# Patient Record
Sex: Male | Born: 1955 | Race: White | Hispanic: No | Marital: Married | Smoking: Former smoker
Health system: Southern US, Community
[De-identification: ages and names within clinical notes are randomized; demographics above are authoritative.]

## PROBLEM LIST (undated history)

## (undated) DIAGNOSIS — G4730 Sleep apnea, unspecified: Secondary | ICD-10-CM

## (undated) DIAGNOSIS — B019 Varicella without complication: Secondary | ICD-10-CM

## (undated) DIAGNOSIS — E785 Hyperlipidemia, unspecified: Secondary | ICD-10-CM

## (undated) DIAGNOSIS — Z86010 Personal history of colonic polyps: Secondary | ICD-10-CM

## (undated) DIAGNOSIS — Z9889 Other specified postprocedural states: Secondary | ICD-10-CM

## (undated) DIAGNOSIS — E039 Hypothyroidism, unspecified: Secondary | ICD-10-CM

## (undated) DIAGNOSIS — M1990 Unspecified osteoarthritis, unspecified site: Secondary | ICD-10-CM

## (undated) DIAGNOSIS — R112 Nausea with vomiting, unspecified: Secondary | ICD-10-CM

## (undated) DIAGNOSIS — I2510 Atherosclerotic heart disease of native coronary artery without angina pectoris: Secondary | ICD-10-CM

## (undated) DIAGNOSIS — I10 Essential (primary) hypertension: Secondary | ICD-10-CM

## (undated) DIAGNOSIS — I6529 Occlusion and stenosis of unspecified carotid artery: Secondary | ICD-10-CM

## (undated) DIAGNOSIS — T148XXA Other injury of unspecified body region, initial encounter: Secondary | ICD-10-CM

## (undated) DIAGNOSIS — C801 Malignant (primary) neoplasm, unspecified: Secondary | ICD-10-CM

## (undated) DIAGNOSIS — G540 Brachial plexus disorders: Secondary | ICD-10-CM

## (undated) HISTORY — PX: FRACTURE SURGERY: SHX138

## (undated) HISTORY — DX: Occlusion and stenosis of unspecified carotid artery: I65.29

## (undated) HISTORY — DX: Other injury of unspecified body region, initial encounter: T14.8XXA

## (undated) HISTORY — DX: Varicella without complication: B01.9

## (undated) HISTORY — DX: Other specified postprocedural states: R11.2

## (undated) HISTORY — DX: Hyperlipidemia, unspecified: E78.5

## (undated) HISTORY — DX: Essential (primary) hypertension: I10

## (undated) HISTORY — PX: OTHER SURGICAL HISTORY: SHX169

## (undated) HISTORY — DX: Other specified postprocedural states: Z98.890

## (undated) HISTORY — DX: Personal history of colonic polyps: Z86.010

---
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
## 1999-10-29 ENCOUNTER — Emergency Department (HOSPITAL_COMMUNITY): Admission: EM | Admit: 1999-10-29 | Discharge: 1999-10-29 | Payer: Self-pay | Admitting: Internal Medicine

## 2004-11-07 ENCOUNTER — Ambulatory Visit: Payer: Self-pay | Admitting: Family Medicine

## 2004-11-08 ENCOUNTER — Encounter: Admission: RE | Admit: 2004-11-08 | Discharge: 2004-11-08 | Payer: Self-pay | Admitting: Family Medicine

## 2005-10-16 ENCOUNTER — Ambulatory Visit: Payer: Self-pay | Admitting: Family Medicine

## 2005-10-26 ENCOUNTER — Ambulatory Visit: Payer: Self-pay | Admitting: Family Medicine

## 2005-11-01 ENCOUNTER — Ambulatory Visit: Payer: Self-pay | Admitting: Family Medicine

## 2005-11-09 ENCOUNTER — Encounter: Payer: Self-pay | Admitting: Family Medicine

## 2005-11-09 ENCOUNTER — Ambulatory Visit: Payer: Self-pay

## 2005-11-28 ENCOUNTER — Ambulatory Visit: Payer: Self-pay | Admitting: Family Medicine

## 2006-01-04 ENCOUNTER — Emergency Department (HOSPITAL_COMMUNITY): Admission: EM | Admit: 2006-01-04 | Discharge: 2006-01-05 | Payer: Self-pay | Admitting: Emergency Medicine

## 2006-01-07 ENCOUNTER — Ambulatory Visit: Payer: Self-pay | Admitting: Family Medicine

## 2006-02-14 ENCOUNTER — Encounter: Admission: RE | Admit: 2006-02-14 | Discharge: 2006-02-14 | Payer: Self-pay | Admitting: Specialist

## 2006-02-15 ENCOUNTER — Inpatient Hospital Stay (HOSPITAL_COMMUNITY): Admission: AC | Admit: 2006-02-15 | Discharge: 2006-02-17 | Payer: Self-pay

## 2006-03-28 ENCOUNTER — Ambulatory Visit: Payer: Self-pay | Admitting: Family Medicine

## 2006-11-25 ENCOUNTER — Ambulatory Visit: Payer: Self-pay | Admitting: Family Medicine

## 2007-01-15 ENCOUNTER — Ambulatory Visit: Payer: Self-pay | Admitting: Family Medicine

## 2008-01-22 ENCOUNTER — Telehealth: Payer: Self-pay | Admitting: Family Medicine

## 2008-01-23 ENCOUNTER — Ambulatory Visit: Payer: Self-pay | Admitting: Family Medicine

## 2008-01-23 DIAGNOSIS — Z9189 Other specified personal risk factors, not elsewhere classified: Secondary | ICD-10-CM | POA: Insufficient documentation

## 2008-01-23 DIAGNOSIS — I10 Essential (primary) hypertension: Secondary | ICD-10-CM | POA: Insufficient documentation

## 2008-04-14 ENCOUNTER — Ambulatory Visit: Payer: Self-pay | Admitting: Family Medicine

## 2008-04-14 LAB — CONVERTED CEMR LAB
Bilirubin Urine: NEGATIVE
Blood in Urine, dipstick: NEGATIVE
Glucose, Urine, Semiquant: NEGATIVE
Ketones, urine, test strip: NEGATIVE
Nitrite: NEGATIVE
Protein, U semiquant: NEGATIVE
Specific Gravity, Urine: 1.02
Urobilinogen, UA: 0.2
WBC Urine, dipstick: NEGATIVE
pH: 8.5

## 2008-04-20 LAB — CONVERTED CEMR LAB
ALT: 35 units/L (ref 0–53)
AST: 41 units/L — ABNORMAL HIGH (ref 0–37)
Albumin: 4.1 g/dL (ref 3.5–5.2)
Alkaline Phosphatase: 44 units/L (ref 39–117)
BUN: 14 mg/dL (ref 6–23)
Basophils Absolute: 0 10*3/uL (ref 0.0–0.1)
Basophils Relative: 0.4 % (ref 0.0–3.0)
Bilirubin, Direct: 0.1 mg/dL (ref 0.0–0.3)
CO2: 28 meq/L (ref 19–32)
Calcium: 9.5 mg/dL (ref 8.4–10.5)
Chloride: 108 meq/L (ref 96–112)
Cholesterol: 200 mg/dL (ref 0–200)
Creatinine, Ser: 1 mg/dL (ref 0.4–1.5)
Eosinophils Absolute: 0.3 10*3/uL (ref 0.0–0.7)
Eosinophils Relative: 5 % (ref 0.0–5.0)
GFR calc Af Amer: 102 mL/min
GFR calc non Af Amer: 84 mL/min
Glucose, Bld: 81 mg/dL (ref 70–99)
HCT: 44.9 % (ref 39.0–52.0)
HDL: 42.4 mg/dL (ref >39.0)
Hemoglobin: 15.9 g/dL (ref 13.0–17.0)
LDL Cholesterol: 135 mg/dL — ABNORMAL HIGH (ref 0–99)
Lymphocytes Relative: 32 % (ref 12.0–46.0)
MCHC: 35.5 g/dL (ref 30.0–36.0)
MCV: 95.2 fL (ref 78.0–100.0)
Monocytes Absolute: 0.7 10*3/uL (ref 0.1–1.0)
Monocytes Relative: 11 % (ref 3.0–12.0)
Neutro Abs: 3.1 10*3/uL (ref 1.4–7.7)
Neutrophils Relative %: 51.6 % (ref 43.0–77.0)
PSA: 0.68 ng/mL (ref 0.10–4.00)
Platelets: 173 10*3/uL (ref 150–400)
Potassium: 3.7 meq/L (ref 3.5–5.1)
RBC: 4.72 M/uL (ref 4.22–5.81)
RDW: 12.3 % (ref 11.5–14.6)
Sodium: 141 meq/L (ref 135–145)
TSH: 1.26 microintl units/mL (ref 0.35–5.50)
Total Bilirubin: 1 mg/dL (ref 0.3–1.2)
Total CHOL/HDL Ratio: 4.7
Total Protein: 6.7 g/dL (ref 6.0–8.3)
Triglycerides: 113 mg/dL (ref 0–149)
VLDL: 23 mg/dL (ref 0–40)
WBC: 6 10*3/uL (ref 4.5–10.5)

## 2008-04-21 ENCOUNTER — Ambulatory Visit: Payer: Self-pay | Admitting: Family Medicine

## 2008-05-14 ENCOUNTER — Ambulatory Visit: Payer: Self-pay | Admitting: Internal Medicine

## 2008-05-28 ENCOUNTER — Ambulatory Visit: Payer: Self-pay | Admitting: Internal Medicine

## 2008-05-28 HISTORY — PX: COLONOSCOPY: SHX174

## 2009-03-18 ENCOUNTER — Ambulatory Visit: Payer: Self-pay | Admitting: Family Medicine

## 2009-03-18 DIAGNOSIS — N509 Disorder of male genital organs, unspecified: Secondary | ICD-10-CM | POA: Insufficient documentation

## 2009-04-17 ENCOUNTER — Emergency Department (HOSPITAL_COMMUNITY): Admission: EM | Admit: 2009-04-17 | Discharge: 2009-04-17 | Payer: Self-pay | Admitting: Emergency Medicine

## 2010-08-23 ENCOUNTER — Ambulatory Visit
Admission: RE | Admit: 2010-08-23 | Discharge: 2010-08-23 | Payer: Self-pay | Source: Home / Self Care | Attending: Family Medicine | Admitting: Family Medicine

## 2010-08-23 ENCOUNTER — Other Ambulatory Visit: Payer: Self-pay | Admitting: Family Medicine

## 2010-08-23 LAB — CBC WITH DIFFERENTIAL/PLATELET
Basophils Absolute: 0 10*3/uL (ref 0.0–0.1)
Basophils Relative: 0.4 % (ref 0.0–3.0)
Eosinophils Absolute: 0.2 10*3/uL (ref 0.0–0.7)
Eosinophils Relative: 2.6 % (ref 0.0–5.0)
HCT: 41.4 % (ref 39.0–52.0)
Hemoglobin: 14.6 g/dL (ref 13.0–17.0)
Lymphocytes Relative: 21.9 % (ref 12.0–46.0)
Lymphs Abs: 1.7 10*3/uL (ref 0.7–4.0)
MCHC: 35.2 g/dL (ref 30.0–36.0)
MCV: 95.6 fl (ref 78.0–100.0)
Monocytes Absolute: 0.7 10*3/uL (ref 0.1–1.0)
Monocytes Relative: 8.7 % (ref 3.0–12.0)
Neutro Abs: 5 10*3/uL (ref 1.4–7.7)
Neutrophils Relative %: 66.4 % (ref 43.0–77.0)
Platelets: 186 10*3/uL (ref 150.0–400.0)
RBC: 4.34 Mil/uL (ref 4.22–5.81)
RDW: 13.6 % (ref 11.5–14.6)
WBC: 7.5 10*3/uL (ref 4.5–10.5)

## 2010-08-23 LAB — HEPATIC FUNCTION PANEL
ALT: 33 U/L (ref 0–53)
AST: 35 U/L (ref 0–37)
Albumin: 4 g/dL (ref 3.5–5.2)
Alkaline Phosphatase: 57 U/L (ref 39–117)
Bilirubin, Direct: 0.1 mg/dL (ref 0.0–0.3)
Total Bilirubin: 0.7 mg/dL (ref 0.3–1.2)
Total Protein: 6.9 g/dL (ref 6.0–8.3)

## 2010-08-23 LAB — TSH: TSH: 0.81 u[IU]/mL (ref 0.35–5.50)

## 2010-08-23 LAB — LIPID PANEL
Cholesterol: 183 mg/dL (ref 0–200)
HDL: 56.8 mg/dL (ref >39.00)
LDL Cholesterol: 118 mg/dL — ABNORMAL HIGH (ref 0–99)
Total CHOL/HDL Ratio: 3
Triglycerides: 42 mg/dL (ref 0.0–149.0)
VLDL: 8.4 mg/dL (ref 0.0–40.0)

## 2010-08-23 LAB — CONVERTED CEMR LAB
Bilirubin Urine: NEGATIVE
Blood in Urine, dipstick: NEGATIVE
Glucose, Urine, Semiquant: NEGATIVE
Ketones, urine, test strip: NEGATIVE
Nitrite: NEGATIVE
Protein, U semiquant: NEGATIVE
Specific Gravity, Urine: <1.005
Urobilinogen, UA: 0.2
WBC Urine, dipstick: NEGATIVE
pH: 6.5

## 2010-08-23 LAB — BASIC METABOLIC PANEL
BUN: 18 mg/dL (ref 6–23)
CO2: 29 mEq/L (ref 19–32)
Calcium: 9.3 mg/dL (ref 8.4–10.5)
Chloride: 104 mEq/L (ref 96–112)
Creatinine, Ser: 0.9 mg/dL (ref 0.4–1.5)
GFR: 97.51 mL/min (ref >60.00)
Glucose, Bld: 83 mg/dL (ref 70–99)
Potassium: 4.2 mEq/L (ref 3.5–5.1)
Sodium: 139 mEq/L (ref 135–145)

## 2010-08-23 LAB — PSA: PSA: 0.64 ng/mL (ref 0.10–4.00)

## 2010-08-28 ENCOUNTER — Encounter: Payer: Self-pay | Admitting: Family Medicine

## 2010-08-28 ENCOUNTER — Ambulatory Visit
Admission: RE | Admit: 2010-08-28 | Discharge: 2010-08-28 | Payer: Self-pay | Source: Home / Self Care | Attending: Family Medicine | Admitting: Family Medicine

## 2010-09-19 NOTE — Assessment & Plan Note (Signed)
Summary: fup meds/discussion//ccm   Vital Signs:  Patient profile:   55 year old male Weight:      245 pounds Temp:     97.6 degrees F oral Pulse rate:   74 / minute BP sitting:   138 / 88  (left arm) Cuff size:   large  Vitals Entered By: Army Fossa CMA (March 18, 2009 11:40 AM) CC: follow up on meds, dizziness when standing sometimes, right testicle pain.    History of Present Illness: Here to refill his BP med and to discuss intermittent mild pains in the right testicle which have been present for the past 5 years. These flare up when he has been driving in his car a lot but otherwise there seems to be no pattern with the pains. No trouble with urinations or ejaculations. No recent trauma, but he is S/P a vasectomy. His BP at home ranges 120-150/80-90 most of the time.   Allergies: 1)  ! Diovan  Past History:  Past Medical History: Reviewed history from 04/21/2008 and no changes required. Chickenpox Hypertension concussion 01-2006, resolved fractured right clavicle, scapula, and finger   Past Surgical History: Reviewed history from 04/21/2008 and no changes required. Arthroscopic knees bilaterally  Review of Systems  The patient denies anorexia, fever, weight loss, weight gain, vision loss, decreased hearing, hoarseness, chest pain, syncope, dyspnea on exertion, peripheral edema, prolonged cough, headaches, hemoptysis, abdominal pain, melena, hematochezia, severe indigestion/heartburn, hematuria, incontinence, genital sores, muscle weakness, suspicious skin lesions, transient blindness, difficulty walking, depression, unusual weight change, abnormal bleeding, enlarged lymph nodes, angioedema, breast masses, and testicular masses.    Physical Exam  General:  Well-developed,well-nourished,in no acute distress; alert,appropriate and cooperative throughout examination Neck:  No deformities, masses, or tenderness noted. Lungs:  Normal respiratory effort, chest expands  symmetrically. Lungs are clear to auscultation, no crackles or wheezes. Heart:  Normal rate and regular rhythm. S1 and S2 normal without gallop, murmur, click, rub or other extra sounds. Abdomen:  Bowel sounds positive,abdomen soft and non-tender without masses, organomegaly or hernias noted. Genitalia:  Testes bilaterally descended without nodularity, tenderness or masses. No scrotal masses or lesions. No penis lesions or urethral discharge. The right epididymis is slightly tender. No torsion.    Impression & Recommendations:  Problem # 1:  HYPERTENSION (ICD-401.9)  His updated medication list for this problem includes:    Moexipril Hcl 7.5 Mg Tabs (Moexipril hcl) .Marland Kitchen... 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 1 by mouth once daily  Patient Instructions: 1)  Meds refilled. The scrotal discomfort seems to be benign and appears to be related to pressure in the area. Adives him to wear loose pants and underwear. His vehicle has bucket seats, and he may be more comfortable in a vehicle with flatter seats.  2)  Please schedule a follow-up appointment as needed .  Prescriptions: MOEXIPRIL HCL 7.5 MG  TABS (MOEXIPRIL HCL) 1 by mouth once daily  #30 x 11   Entered and Authorized by:   Nelwyn Salisbury MD   Signed by:   Nelwyn Salisbury MD on 03/18/2009   Method used:   [facility]        [facility] [address]  [phone]* (retail)       [address] [place], [place]  [address]       Ph:  [phone] or [phone]       Fax: [phone]   RxID:   [id number]

## 2010-09-19 NOTE — Assessment & Plan Note (Signed)
Summary: blood pressure meds/mhf   Vital Signs:  Patient Profile:   55 Years Old Male Weight:      255 pounds Temp:     98.2 degrees F oral Pulse rate:   80 / minute Pulse rhythm:   regular BP sitting:   134 / 88  (left arm) Cuff size:   large  Vitals Entered By: Alfred Levins, CMA (January 23, 2008 3:09 PM)                 Chief Complaint:  renew meds.  History of Present Illness: Here for med refills. His BP seems to be stable.     Current Allergies: ! DIOVAN  Past Medical History:    Reviewed history and no changes required:       Chickenpox       Hypertension  Past Surgical History:    Arthroscopic bilaterally   Family History:    Family History of Colon CA 1st degree relative <60    Family History Hypertension  Social History:    Married    Alcohol use-yes    Drug use-no    Regular exercise-yes    Former smoker   Risk Factors:  Drug use:  no Alcohol use:  yes Exercise:  yes   Review of Systems  The patient denies anorexia, fever, weight loss, weight gain, vision loss, decreased hearing, hoarseness, chest pain, syncope, dyspnea on exertion, peripheral edema, prolonged cough, headaches, hemoptysis, abdominal pain, melena, hematochezia, severe indigestion/heartburn, hematuria, incontinence, genital sores, muscle weakness, suspicious skin lesions, transient blindness, difficulty walking, depression, unusual weight change, abnormal bleeding, enlarged lymph nodes, angioedema, breast masses, and testicular masses.     Physical Exam  General:     Well-developed,well-nourished,in no acute distress; alert,appropriate and cooperative throughout examination    Impression & Recommendations:  Problem # 1:  HYPERTENSION (ICD-401.9)  His updated medication list for this problem includes:    Moexipril Hcl 7.5 Mg Tabs (Moexipril hcl) .Marland Kitchen... 1 by mouth once daily   Complete Medication List: 1)  Moexipril Hcl 7.5 Mg Tabs (Moexipril hcl) .Marland Kitchen.. 1 by mouth once  daily   Patient Instructions: 1)  Refilled his med. He will set up cpx soon.   Prescriptions: MOEXIPRIL HCL 7.5 MG  TABS (MOEXIPRIL HCL) 1 by mouth once daily  #30 x 11   Entered and Authorized by:   Nelwyn Salisbury MD   Signed by:   Nelwyn Salisbury MD on 01/23/2008   Method used:   Electronically [facility] to ...       [facility]  [phone]*       [address]       [place], [place]  [address]       Ph: [phone] or [phone]       Fax: [phone]   RxID:   [id number]  ]

## 2010-09-19 NOTE — Procedures (Signed)
Summary: Colonoscopy   Colonoscopy  Procedure date:  05/28/2008  Findings:      Location:  [facility].    Procedures Next Due Date:    Colonoscopy: 05/2018  Patient Name: Terry Holt, Terry Holt. MRN: [id number] Procedure Procedures: Colonoscopy CPT: [id number].  Personnel: Endoscopist: Iva Boop, MD, [facility].  Referred By: Gershon Crane, MD.  Exam Location: Exam performed in Outpatient Clinic. Outpatient  Patient Consent: Procedure, Alternatives, Risks and Benefits discussed, consent obtained, from patient. Consent was obtained by the RN.  Indications  Average Risk Screening Routine.  History  Current Medications: Patient is not currently taking Coumadin.  Allergies: No known allergies.  Pre-Exam Physical: Performed May 28, 2008. Cardio-pulmonary exam, Rectal exam, HEENT exam , Abdominal exam, Mental status exam WNL.  Comments: Pt. history reviewed/updated, physical exam performed prior to initiation of sedation? YES Prostate is normal Exam Exam: Extent of exam reached: Cecum, extent intended: Cecum.  The cecum was identified by appendiceal orifice and IC valve. Patient position: on left side. Time to Cecum: 00:03:17. Time for Withdrawl: 00:10:59. Colon retroflexion performed. Images taken. ASA Classification: II. Tolerance: excellent.  Monitoring: Pulse and BP monitoring, Oximetry used. Supplemental O2 given.  Colon Prep Used MiraLax for colon prep. Prep results: good.  Sedation Meds: Patient assessed and found to be appropriate for moderate (conscious) sedation. Fentanyl 50 mcg. given IV. Versed 8 mg. given IV. Benadryl 25 given IV.  Findings - NORMAL EXAM: Cecum to Descending Colon.  - DIVERTICULOSIS: Sigmoid Colon.  HEMORRHOIDS: Internal. Size: Grade I.   Assessment  Comments: 1) MILD SIGMOID DIVERTICULOSIS 2) INTERNAL HEMORRHOIDS 3) GOOD PREP 4) OTHERWISE NORMAL SCREENING COLONOSCOPY Events  Unplanned Interventions: No intervention  was required.  Plans Patient Education: Patient given standard instructions for: Diverticulosis. Hemorrhoids. Patient instructed to get routine colonoscopy every 10 years.  Disposition: After procedure patient sent to recovery. After recovery patient sent home.    cc: Gershon Crane, MD   This report was created from the original endoscopy report, which was reviewed and signed by the above listed endoscopist.

## 2010-09-19 NOTE — Progress Notes (Signed)
Summary: refill  Phone Note Call from Patient Call back at Work Phone [phone]   Caller: pt live Call For: Clent Ridges  Summary of Call: moexipril hcl 7.5 mg  He is out Has scheduled appt for 6-5 Could you refill till then.  [facility]  Initial call taken by: Roselle Locus,  January 22, 2008 8:47 AM      Prescriptions: MOEXIPRIL HCL 7.5 MG  TABS (MOEXIPRIL HCL) 1 by mouth once daily needs ov  #30 x 0   Entered by:   Alfred Levins, CMA   Authorized by:   Nelwyn Salisbury MD   Signed by:   Alfred Levins, CMA on 01/22/2008   Method used:   Electronically sent to ...       [facility]  [phone]*       [address]       [place], [place]  [address]       Ph: [phone] or [phone]       Fax: [phone]   RxID:   [id number]

## 2010-09-19 NOTE — Assessment & Plan Note (Signed)
Summary: cpx/njr   Vital Signs:  Patient Profile:   55 Years Old Male Height:     73.5 inches Weight:      255 pounds Temp:     98.0 degrees F oral Pulse rate:   89 / minute Pulse rhythm:   regular BP sitting:   124 / 88  (left arm) Cuff size:   large  Vitals Entered By: Alfred Levins, CMA (April 21, 2008 1:39 PM)                 Chief Complaint:  cpx.  History of Present Illness: 55 yr old male for cpx. Feels good. Knows he is overweight.     Current Allergies (reviewed today): ! DIOVAN  Past Medical History:    Reviewed history from 01/23/2008 and no changes required:       Chickenpox       Hypertension       concussion 01-2006, resolved       fractured right clavicle, scapula, and finger   Past Surgical History:    Reviewed history from 01/23/2008 and no changes required:       Arthroscopic knees bilaterally   Family History:    Reviewed history from 01/23/2008 and no changes required:       Family History of Colon CA 1st degree relative <60       Family History Hypertension  Social History:    Reviewed history from 01/23/2008 and no changes required:       Married       Alcohol use-yes       Drug use-no       Regular exercise-yes       Former smoker    Review of Systems  The patient denies anorexia, fever, weight loss, weight gain, vision loss, decreased hearing, hoarseness, chest pain, syncope, dyspnea on exertion, peripheral edema, prolonged cough, headaches, hemoptysis, abdominal pain, melena, hematochezia, severe indigestion/heartburn, hematuria, incontinence, genital sores, muscle weakness, suspicious skin lesions, transient blindness, difficulty walking, depression, unusual weight change, abnormal bleeding, enlarged lymph nodes, angioedema, breast masses, and testicular masses.     Physical Exam  General:     overweight-appearing.   Head:     Normocephalic and atraumatic without obvious abnormalities. No apparent alopecia or balding.  Eyes:     No corneal or conjunctival inflammation noted. EOMI. Perrla. Funduscopic exam benign, without hemorrhages, exudates or papilledema. Vision grossly normal. Ears:     External ear exam shows no significant lesions or deformities.  Otoscopic examination reveals clear canals, tympanic membranes are intact bilaterally without bulging, retraction, inflammation or discharge. Hearing is grossly normal bilaterally. Nose:     External nasal examination shows no deformity or inflammation. Nasal mucosa are pink and moist without lesions or exudates. Mouth:     Oral mucosa and oropharynx without lesions or exudates.  Teeth in good repair. Neck:     No deformities, masses, or tenderness noted. Chest Wall:     No deformities, masses, tenderness or gynecomastia noted. Lungs:     Normal respiratory effort, chest expands symmetrically. Lungs are clear to auscultation, no crackles or wheezes. Heart:     Normal rate and regular rhythm. S1 and S2 normal without gallop, murmur, click, rub or other extra sounds. EKG normal. Abdomen:     Bowel sounds positive,abdomen soft and non-tender without masses, organomegaly or hernias noted. Rectal:     No external abnormalities noted. Normal sphincter tone. No rectal masses or tenderness. Heme neg. Genitalia:  Testes bilaterally descended without nodularity, tenderness or masses. No scrotal masses or lesions. No penis lesions or urethral discharge. Prostate:     Prostate gland firm and smooth, no enlargement, nodularity, tenderness, mass, asymmetry or induration. Msk:     No deformity or scoliosis noted of thoracic or lumbar spine.   Pulses:     R and L carotid,radial,femoral,dorsalis pedis and posterior tibial pulses are full and equal bilaterally Extremities:     No clubbing, cyanosis, edema, or deformity noted with normal full range of motion of all joints.   Neurologic:     No cranial nerve deficits noted. Station and gait are normal. Plantar  reflexes are down-going bilaterally. DTRs are symmetrical throughout. Sensory, motor and coordinative functions appear intact. Skin:     Intact without suspicious lesions or rashes Cervical Nodes:     No lymphadenopathy noted Axillary Nodes:     No palpable lymphadenopathy Inguinal Nodes:     No significant adenopathy Psych:     Cognition and judgment appear intact. Alert and cooperative with normal attention span and concentration. No apparent delusions, illusions, hallucinations    Impression & Recommendations:  Problem # 1:  WELL ADULT EXAM (ICD-V70.0)  Orders: Hemoccult Guaiac-1 spec.(in office) (78469) EKG w/ Interpretation (93000) Gastroenterology Referral (GI)   Complete Medication List: 1)  Moexipril Hcl 7.5 Mg Tabs (Moexipril hcl) .Marland Kitchen.. 1 by mouth once daily   Patient Instructions: 1)  You need to lose weight. Consider a lower calorie diet and regular exercise.  2)  Schedule a colonoscopy/sigmoidoscopy to help detect colon cancer.   ]

## 2010-09-21 NOTE — Assessment & Plan Note (Signed)
Summary: cpx//ccm   Vital Signs:  Patient profile:   55 year old male Height:      72.5 inches Weight:      203 pounds O2 Sat:      97 % Temp:     98.8 degrees F Pulse rate:   66 / minute BP sitting:   140 / 90  (left arm) Cuff size:   large  Vitals Entered By: Pura Spice, RN (August 28, 2010 2:01 PM) CC: cpx    History of Present Illness: 55 yr old male for a cpx. He feels great and has no complaints. He has lost a lot of weight and he has stopped taking his BP meds. His BP at home every day is around the 120s and 130s over 80s.   Allergies: 1)  ! Diovan  Past History:  Past Medical History: Reviewed history from 04/21/2008 and no changes required. Chickenpox Hypertension concussion 01-2006, resolved fractured right clavicle, scapula, and finger   Past Surgical History: Reviewed history from 04/21/2008 and no changes required. Arthroscopic knees bilaterally  Family History: Reviewed history from 01/23/2008 and no changes required. Family History of Colon CA 1st degree relative <60 Family History Hypertension  Social History: Reviewed history from 01/23/2008 and no changes required. Married Alcohol use-yes Drug use-no Regular exercise-yes Former smoker  Review of Systems  The patient denies anorexia, fever, weight gain, vision loss, decreased hearing, hoarseness, chest pain, syncope, dyspnea on exertion, peripheral edema, prolonged cough, headaches, hemoptysis, abdominal pain, melena, hematochezia, severe indigestion/heartburn, hematuria, incontinence, genital sores, muscle weakness, suspicious skin lesions, transient blindness, difficulty walking, depression, unusual weight change, abnormal bleeding, enlarged lymph nodes, angioedema, breast masses, and testicular masses.    Physical Exam  General:  Well-developed,well-nourished,in no acute distress; alert,appropriate and cooperative throughout examination Head:  Normocephalic and atraumatic without obvious  abnormalities. No apparent alopecia or balding. Eyes:  No corneal or conjunctival inflammation noted. EOMI. Perrla. Funduscopic exam benign, without hemorrhages, exudates or papilledema. Vision grossly normal. Ears:  External ear exam shows no significant lesions or deformities.  Otoscopic examination reveals clear canals, tympanic membranes are intact bilaterally without bulging, retraction, inflammation or discharge. Hearing is grossly normal bilaterally. Nose:  External nasal examination shows no deformity or inflammation. Nasal mucosa are pink and moist without lesions or exudates. Mouth:  Oral mucosa and oropharynx without lesions or exudates.  Teeth in good repair. Neck:  No deformities, masses, or tenderness noted. Chest Wall:  No deformities, masses, tenderness or gynecomastia noted. Lungs:  Normal respiratory effort, chest expands symmetrically. Lungs are clear to auscultation, no crackles or wheezes. Heart:  Normal rate and regular rhythm. S1 and S2 normal without gallop, murmur, click, rub or other extra sounds. EKG normal Abdomen:  Bowel sounds positive,abdomen soft and non-tender without masses, organomegaly or hernias noted. Rectal:  No external abnormalities noted. Normal sphincter tone. No rectal masses or tenderness. Heme neg.  Genitalia:  Testes bilaterally descended without nodularity, tenderness or masses. No scrotal masses or lesions. No penis lesions or urethral discharge. Prostate:  Prostate gland firm and smooth, no enlargement, nodularity, tenderness, mass, asymmetry or induration. Msk:  No deformity or scoliosis noted of thoracic or lumbar spine.   Pulses:  R and L carotid,radial,femoral,dorsalis pedis and posterior tibial pulses are full and equal bilaterally Extremities:  No clubbing, cyanosis, edema, or deformity noted with normal full range of motion of all joints.   Neurologic:  No cranial nerve deficits noted. Station and gait are normal. Plantar reflexes are  down-going bilaterally. DTRs are symmetrical throughout. Sensory, motor and coordinative functions appear intact. Skin:  Intact without suspicious lesions or rashes Cervical Nodes:  No lymphadenopathy noted Axillary Nodes:  No palpable lymphadenopathy Inguinal Nodes:  No significant adenopathy Psych:  Cognition and judgment appear intact. Alert and cooperative with normal attention span and concentration. No apparent delusions, illusions, hallucinations   Impression & Recommendations:  Problem # 1:  WELL ADULT EXAM (ICD-V70.0)  Orders: Hemoccult Guaiac-1 spec.(in office) (82270) EKG w/ Interpretation (93000)  Other Orders: Tdap => 50yrs IM (57846) Admin 1st Vaccine (96295)  Patient Instructions: 1)  Please schedule a follow-up appointment in 1 year.    Orders Added: 1)  Tdap => 7yrs IM [90715] 2)  Admin 1st Vaccine [90471] 3)  Est. Patient 40-64 years [99396] 4)  Hemoccult Guaiac-1 spec.(in office) [82270] 5)  EKG w/ Interpretation [93000]   Immunizations Administered:  Tetanus Vaccine:    Vaccine Type: Tdap    Site: left deltoid    Mfr: GlaxoSmithKline    Dose: 0.5 ml    Route: IM    Given by: Pura Spice, RN    Exp. Date: 06/08/2012    Lot #: [id number]    VIS given: 07/07/08 version given August 28, 2010.   Immunizations Administered:  Tetanus Vaccine:    Vaccine Type: Tdap    Site: left deltoid    Mfr: GlaxoSmithKline    Dose: 0.5 ml    Route: IM    Given by: Pura Spice, RN    Exp. Date: 06/08/2012    Lot #: [id number]    VIS given: 07/07/08 version given August 28, 2010.

## 2010-12-27 ENCOUNTER — Encounter: Payer: Self-pay | Admitting: Family Medicine

## 2010-12-27 ENCOUNTER — Ambulatory Visit (INDEPENDENT_AMBULATORY_CARE_PROVIDER_SITE_OTHER): Payer: BC Managed Care – PPO | Admitting: Family Medicine

## 2010-12-27 VITALS — BP 130/92 | HR 57 | Temp 98.0°F | Resp 14 | Wt 200.5 lb

## 2010-12-27 DIAGNOSIS — J309 Allergic rhinitis, unspecified: Secondary | ICD-10-CM

## 2010-12-27 DIAGNOSIS — N529 Male erectile dysfunction, unspecified: Secondary | ICD-10-CM

## 2010-12-27 LAB — TESTOSTERONE: Testosterone: 250.06 ng/dL — ABNORMAL LOW (ref 350.00–890.00)

## 2010-12-27 MED ORDER — FLUTICASONE PROPIONATE 50 MCG/ACT NA SUSP: 2.0000 | Freq: Every day | NASAL | Status: DC

## 2010-12-27 NOTE — Progress Notes (Signed)
  Subjective:    Patient ID: Terry Holt, male    DOB: 03/09/1956, 55 y.o.   MRN: [id number]  HPI Here with 2 problems. First he has had a lot of nasal congestion for several months. This makes it hard to smell or taste anything. He has tried several OTC antihistamines with no results. Also for several months he has noticed that his erections are not as firm as they used to be and his orgasms are not as intense as before. He had normal labs in January when we did his cpx. He denies any anxiety or depression issues.    Review of Systems  Constitutional: Negative.   HENT: Positive for congestion. Negative for nosebleeds, postnasal drip and sinus pressure.   Eyes: Negative.   Respiratory: Negative.   Genitourinary: Negative.   Psychiatric/Behavioral: Negative.        Objective:   Physical Exam  Constitutional: He is oriented to person, place, and time. He appears well-developed and well-nourished.  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 The membranes are a bit inflamed  Neurological: He is alert and oriented to person, place, and time.  Psychiatric: He has a normal mood and affect. His behavior is normal.          Assessment & Plan:  Try a nasal steroid spray. Gave him samples to try Cialis 20mg  prn. Check a testosterone level.

## 2010-12-28 ENCOUNTER — Telehealth: Payer: Self-pay | Admitting: *Deleted

## 2010-12-28 MED ORDER — TESTOSTERONE 20.25 MG/ACT (1.62%) TD GEL: 10.0000 g/d | TRANSDERMAL | Status: DC

## 2010-12-28 NOTE — Telephone Encounter (Signed)
Message copied by Burnard Leigh on Thu Dec 28, 2010 12:58 PM ------      Message from: Dwaine Deter      Created: Wed Dec 27, 2010  5:26 PM       His testosterone level is low. I suggest starting Androgel 1.62%pump,  to apply 8 pumps daily. Call in 6 month supply and recheck a level in 6 months

## 2010-12-28 NOTE — Telephone Encounter (Signed)
LMOM to have Pt call our office to discuss lab results. [facility]. [Pt needs to be informed of this]

## 2010-12-28 NOTE — Telephone Encounter (Signed)
Message copied by Burnard Leigh on Thu Dec 28, 2010 12:49 PM ------      Message from: Dwaine Deter      Created: Wed Dec 27, 2010  5:26 PM       His testosterone level is low. I suggest starting Androgel 1.62%pump,  to apply 8 pumps daily. Call in 6 month supply and recheck a level in 6 months

## 2010-12-29 ENCOUNTER — Telehealth: Payer: Self-pay | Admitting: Family Medicine

## 2010-12-29 NOTE — Telephone Encounter (Signed)
Pt aware. Pt also notes that [facility] and a Prior Auth is required for Androgel. PA request received, per Aram Beecham.

## 2010-12-29 NOTE — Telephone Encounter (Signed)
Please clarify directions given and quantity for androgel pump.  Directions state 10grams qd however quantiity was only written for 75 grams, which would not be enough for a month supply.

## 2010-12-29 NOTE — Telephone Encounter (Signed)
Per Dr. Clent Ridges, 4 pumps qd. Rx instructions clarified with pharm. Corrected Prior Auth sent by pharm.

## 2010-12-29 NOTE — Telephone Encounter (Signed)
Requesting lab results

## 2011-01-02 ENCOUNTER — Telehealth: Payer: Self-pay | Admitting: *Deleted

## 2011-01-02 NOTE — Telephone Encounter (Signed)
Needs update about a prescription that his insurance is attempted to get a pre-cert from [place].

## 2011-01-03 NOTE — Telephone Encounter (Signed)
Left message. Dr fry has pa form. I will call patient when completed.
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
## 2011-01-25 ENCOUNTER — Ambulatory Visit (INDEPENDENT_AMBULATORY_CARE_PROVIDER_SITE_OTHER): Payer: BC Managed Care – PPO | Admitting: Family Medicine

## 2011-01-25 ENCOUNTER — Encounter: Payer: Self-pay | Admitting: Family Medicine

## 2011-01-25 VITALS — BP 140/92 | HR 74 | Temp 98.6°F | Wt 203.0 lb

## 2011-01-25 DIAGNOSIS — N529 Male erectile dysfunction, unspecified: Secondary | ICD-10-CM

## 2011-01-25 DIAGNOSIS — J3489 Other specified disorders of nose and nasal sinuses: Secondary | ICD-10-CM

## 2011-01-25 DIAGNOSIS — E291 Testicular hypofunction: Secondary | ICD-10-CM

## 2011-01-25 NOTE — Progress Notes (Signed)
  Subjective:    Patient ID: Terry Holt, male    DOB: 01/10/1956, 55 y.o.   MRN: [id number]  HPI Here to follow up on hypogonadism, ED, nasal congestion, and loss of taste and smell. He tried Flonase sprays and did not see any difference, so he stopped them. He wants to try something else. He is very happy with Androgel. He feels better and he has already seen improvements in his erections. He did not like Cialis because it caused his face to flush.    Review of Systems  Constitutional: Negative.   HENT: Positive for congestion.   Genitourinary: Negative.        Objective:   Physical Exam  Constitutional: He appears well-developed and well-nourished.  HENT:  Head: Normocephalic and atraumatic.  Right Ear: External ear normal.  Left Ear: External ear normal.  Nose: Nose normal.  Mouth/Throat: Oropharynx is clear and moist. No oropharyngeal exudate.          Assessment & Plan:  Stop Cialis and Flonase. Refer to ENT for nasal obstruction. Continue Androgel.

## 2011-02-23 ENCOUNTER — Other Ambulatory Visit: Payer: Self-pay | Admitting: Otolaryngology

## 2011-02-23 DIAGNOSIS — R430 Anosmia: Secondary | ICD-10-CM

## 2011-03-13 ENCOUNTER — Ambulatory Visit
Admission: RE | Admit: 2011-03-13 | Discharge: 2011-03-13 | Disposition: A | Discharge status: Home / Self Care | Payer: BC Managed Care – PPO | Source: Ambulatory Visit | Attending: Otolaryngology | Admitting: Otolaryngology

## 2011-03-13 DIAGNOSIS — R430 Anosmia: Secondary | ICD-10-CM

## 2011-03-13 MED ORDER — GADOBENATE DIMEGLUMINE 529 MG/ML IV SOLN
18.0000 mL | Freq: Once | INTRAVENOUS | Status: AC | PRN
  Administered 2011-03-13: 18 mL via INTRAVENOUS

## 2011-06-11 ENCOUNTER — Encounter: Payer: Self-pay | Admitting: Family Medicine

## 2011-06-12 ENCOUNTER — Encounter: Payer: Self-pay | Admitting: Family Medicine

## 2011-06-12 ENCOUNTER — Ambulatory Visit (INDEPENDENT_AMBULATORY_CARE_PROVIDER_SITE_OTHER): Payer: BC Managed Care – PPO | Admitting: Family Medicine

## 2011-06-12 VITALS — BP 128/84 | HR 76 | Temp 98.3°F | Wt 204.0 lb

## 2011-06-12 DIAGNOSIS — M940 Chondrocostal junction syndrome [Tietze]: Secondary | ICD-10-CM
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
## 2011-07-03 ENCOUNTER — Telehealth: Payer: Self-pay | Admitting: Family Medicine

## 2011-07-03 DIAGNOSIS — E291 Testicular hypofunction: Secondary | ICD-10-CM

## 2011-07-03 NOTE — Telephone Encounter (Signed)
Pt is on androgel requesting blood work.

## 2011-07-04 ENCOUNTER — Other Ambulatory Visit (INDEPENDENT_AMBULATORY_CARE_PROVIDER_SITE_OTHER): Payer: BC Managed Care – PPO

## 2011-07-04 DIAGNOSIS — E291 Testicular hypofunction: Secondary | ICD-10-CM

## 2011-07-04 NOTE — Telephone Encounter (Signed)
Pt is sch for lab today 4pm

## 2011-07-04 NOTE — Telephone Encounter (Signed)
I put in the order. He just needs to schedule it

## 2011-07-09 ENCOUNTER — Other Ambulatory Visit: Payer: Self-pay | Admitting: Family Medicine

## 2011-07-09 ENCOUNTER — Telehealth: Payer: Self-pay | Admitting: Family Medicine

## 2011-07-09 DIAGNOSIS — E291 Testicular hypofunction: Secondary | ICD-10-CM

## 2011-07-09 LAB — TESTOSTERONE: Testosterone: 201.55 ng/dL — ABNORMAL LOW (ref 350.00–890.00)

## 2011-07-09 NOTE — Progress Notes (Signed)
Quick Note:  Pt informed ______ 

## 2011-07-09 NOTE — Telephone Encounter (Signed)
We will refer to Urology

## 2011-07-10 ENCOUNTER — Telehealth: Payer: Self-pay | Admitting: Family Medicine

## 2011-07-10 NOTE — Telephone Encounter (Signed)
Pt has a appointment to see Urology in December. He only has about a week or so of the Androgel left, should he get more to last until his visit?

## 2011-07-11 NOTE — Telephone Encounter (Signed)
No I would just wait and see what they want to do

## 2011-07-11 NOTE — Telephone Encounter (Signed)
Spoke with pt

## 2013-02-23 ENCOUNTER — Encounter: Payer: Self-pay | Admitting: Family Medicine

## 2013-02-23 ENCOUNTER — Ambulatory Visit (INDEPENDENT_AMBULATORY_CARE_PROVIDER_SITE_OTHER): Payer: BC Managed Care – PPO | Admitting: Family Medicine

## 2013-02-23 VITALS — BP 168/94 | HR 71 | Temp 98.6°F | Ht 72.75 in | Wt 209.0 lb

## 2013-02-23 DIAGNOSIS — Z0000 Encounter for general adult medical examination without abnormal findings: Secondary | ICD-10-CM

## 2013-02-23 LAB — CBC WITH DIFFERENTIAL/PLATELET
Basophils Absolute: 0 10*3/uL (ref 0.0–0.1)
Basophils Relative: 0.4 % (ref 0.0–3.0)
Eosinophils Absolute: 0.1 10*3/uL (ref 0.0–0.7)
Eosinophils Relative: 0.8 % (ref 0.0–5.0)
HCT: 45.3 % (ref 39.0–52.0)
Hemoglobin: 15.6 g/dL (ref 13.0–17.0)
Lymphocytes Relative: 18.6 % (ref 12.0–46.0)
Lymphs Abs: 1.5 10*3/uL (ref 0.7–4.0)
MCHC: 34.4 g/dL (ref 30.0–36.0)
MCV: 97.3 fl (ref 78.0–100.0)
Monocytes Absolute: 0.6 10*3/uL (ref 0.1–1.0)
Monocytes Relative: 7.7 % (ref 3.0–12.0)
Neutro Abs: 5.7 10*3/uL (ref 1.4–7.7)
Neutrophils Relative %: 72.5 % (ref 43.0–77.0)
Platelets: 196 10*3/uL (ref 150.0–400.0)
RBC: 4.66 Mil/uL (ref 4.22–5.81)
RDW: 13.7 % (ref 11.5–14.6)
WBC: 7.8 10*3/uL (ref 4.5–10.5)

## 2013-02-23 LAB — BASIC METABOLIC PANEL
BUN: 18 mg/dL (ref 6–23)
CO2: 27 mEq/L (ref 19–32)
Calcium: 9.4 mg/dL (ref 8.4–10.5)
Chloride: 103 mEq/L (ref 96–112)
Creatinine, Ser: 1 mg/dL (ref 0.4–1.5)
GFR: 85.21 mL/min (ref >60.00)
Glucose, Bld: 91 mg/dL (ref 70–99)
Potassium: 3.7 mEq/L (ref 3.5–5.1)
Sodium: 139 mEq/L (ref 135–145)

## 2013-02-23 LAB — POCT URINALYSIS DIPSTICK
Bilirubin, UA: NEGATIVE
Blood, UA: NEGATIVE
Glucose, UA: NEGATIVE
Ketones, UA: NEGATIVE
Leukocytes, UA: NEGATIVE
Nitrite, UA: NEGATIVE
Spec Grav, UA: 1.02
Urobilinogen, UA: 0.2
pH, UA: 7

## 2013-02-23 LAB — LIPID PANEL
Cholesterol: 194 mg/dL (ref 0–200)
HDL: 62.7 mg/dL (ref >39.00)
LDL Cholesterol: 116 mg/dL — ABNORMAL HIGH (ref 0–99)
Total CHOL/HDL Ratio: 3
Triglycerides: 76 mg/dL (ref 0.0–149.0)
VLDL: 15.2 mg/dL (ref 0.0–40.0)

## 2013-02-23 LAB — HEPATIC FUNCTION PANEL
ALT: 20 U/L (ref 0–53)
AST: 27 U/L (ref 0–37)
Albumin: 4.5 g/dL (ref 3.5–5.2)
Alkaline Phosphatase: 43 U/L (ref 39–117)
Bilirubin, Direct: 0.1 mg/dL (ref 0.0–0.3)
Total Bilirubin: 1 mg/dL (ref 0.3–1.2)
Total Protein: 6.9 g/dL (ref 6.0–8.3)

## 2013-02-23 LAB — PSA: PSA: 0.88 ng/mL (ref 0.10–4.00)

## 2013-02-23 LAB — TSH: TSH: 1.05 u[IU]/mL (ref 0.35–5.50)

## 2013-02-23 NOTE — Progress Notes (Signed)
  Subjective:    Patient ID: Terry Holt, male    DOB: 24-Sep-1955, 57 y.o.   MRN: [id number]  HPI 57 yr old male for a cpx. He feels well although he mentions some occasional fluttering in the chest a few weeks ago, especially in bed at night. No SOB or chest pain. He has been under a lot of stress at work. Also for a time he was drinking a lot of coffee and taking Mucinex D for allergies. As soon as he stopped th Mucinex D and cut back on the coffee, the fluttering went away.   Review of Systems  Constitutional: Negative.   HENT: Negative.   Eyes: Negative.   Respiratory: Negative.   Cardiovascular: Negative.   Gastrointestinal: Negative.   Genitourinary: Negative.   Musculoskeletal: Negative.   Skin: Negative.   Neurological: Negative.   Psychiatric/Behavioral: Negative.        Objective:   Physical Exam  Constitutional: He is oriented to person, place, and time. He appears well-developed and well-nourished. No distress.  HENT:  Head: Normocephalic and atraumatic.  Right Ear: External ear normal.  Left Ear: External ear normal.  Nose: Nose normal.  Mouth/Throat: Oropharynx is clear and moist. No oropharyngeal exudate.  Eyes: Conjunctivae and EOM are normal. Pupils are equal, round, and reactive to light. Right eye exhibits no discharge. Left eye exhibits no discharge. No scleral icterus.  Neck: Neck supple. No JVD present. No tracheal deviation present. No thyromegaly present.  Cardiovascular: Normal rate, regular rhythm, normal heart sounds and intact distal pulses.  Exam reveals no gallop and no friction rub.   No murmur heard. EKG normal   Pulmonary/Chest: Effort normal and breath sounds normal. No respiratory distress. He has no wheezes. He has no rales. He exhibits no tenderness.  Abdominal: Soft. Bowel sounds are normal. He exhibits no distension and no mass. There is no tenderness. There is no rebound and no guarding.  Genitourinary: Rectum normal, prostate normal  and penis normal. Guaiac negative stool. No penile tenderness.  Musculoskeletal: Normal range of motion. He exhibits no edema and no tenderness.  Lymphadenopathy:    He has no cervical adenopathy.  Neurological: He is alert and oriented to person, place, and time. He has normal reflexes. No cranial nerve deficit. He exhibits normal muscle tone. Coordination normal.  Skin: Skin is warm and dry. No rash noted. He is not diaphoretic. No erythema. No pallor.  Psychiatric: He has a normal mood and affect. His behavior is normal. Judgment and thought content normal.          Assessment & Plan:  Well exam. Get fasting labs today. He was probably having palpitations from too much caffeine and pseudoephedrine. He will return if these come back.

## 2013-02-25 NOTE — Progress Notes (Signed)
Quick Note:  I released results in my chart. ______ 

## 2013-06-25 ENCOUNTER — Other Ambulatory Visit: Payer: Self-pay

## 2013-10-02 ENCOUNTER — Encounter: Payer: Self-pay | Admitting: Family Medicine

## 2013-10-02 ENCOUNTER — Ambulatory Visit (INDEPENDENT_AMBULATORY_CARE_PROVIDER_SITE_OTHER): Payer: BC Managed Care – PPO | Admitting: Family Medicine

## 2013-10-02 VITALS — BP 142/80 | HR 79 | Temp 98.1°F | Ht 72.75 in | Wt 213.0 lb

## 2013-10-02 DIAGNOSIS — R109 Unspecified abdominal pain: Secondary | ICD-10-CM

## 2013-10-02 DIAGNOSIS — R1032 Left lower quadrant pain: Secondary | ICD-10-CM

## 2013-10-02 NOTE — Progress Notes (Signed)
   Subjective:    Patient ID: Terry Holt, male    DOB: 12/29/1955, 58 y.o.   MRN: [id number]  HPI Here for 2 weeks of intermittent mild pains in the left groin. No lumps or bulges. No change in BMs or urinations. He works out with [employer] and has no pain.    Review of Systems  Constitutional: Negative.   Gastrointestinal: Positive for abdominal pain. Negative for nausea, vomiting, diarrhea, constipation, blood in stool, abdominal distention and rectal pain.  Genitourinary: Negative.        Objective:   Physical Exam  Constitutional: He appears well-developed and well-nourished.  Abdominal: Soft. Bowel sounds are normal. He exhibits no distension and no mass. There is no tenderness. There is no rebound and no guarding. Hernia confirmed negative in the right inguinal area and confirmed negative in the left inguinal area.  Genitourinary: Testes normal. Cremasteric reflex is present. Right testis shows no mass, no swelling and no tenderness. Left testis shows no mass, no swelling and no tenderness.  Lymphadenopathy:       Right: No inguinal adenopathy present.       Left: No inguinal adenopathy present.          Assessment & Plan:  It sounds like he has an early hernia but we cannot detect this on exam. He will monitor the situation and follow up prn

## 2013-10-02 NOTE — Progress Notes (Signed)
Pre visit review using our clinic review tool, if applicable. No additional management support is needed unless otherwise documented below in the visit note. 

## 2014-03-26 ENCOUNTER — Ambulatory Visit: Payer: BC Managed Care – PPO | Admitting: Family Medicine

## 2014-05-05 ENCOUNTER — Encounter: Payer: Self-pay | Admitting: Family Medicine

## 2014-05-05 ENCOUNTER — Ambulatory Visit (INDEPENDENT_AMBULATORY_CARE_PROVIDER_SITE_OTHER): Payer: BC Managed Care – PPO | Admitting: Family Medicine

## 2014-05-05 VITALS — BP 139/84 | HR 67 | Temp 97.9°F | Ht 72.75 in | Wt 210.0 lb

## 2014-05-05 DIAGNOSIS — Z23 Encounter for immunization: Secondary | ICD-10-CM

## 2014-05-05 DIAGNOSIS — K4090 Unilateral inguinal hernia, without obstruction or gangrene, not specified as recurrent: Secondary | ICD-10-CM

## 2014-05-05 NOTE — Progress Notes (Signed)
Pre visit review using our clinic review tool, if applicable. No additional management support is needed unless otherwise documented below in the visit note. 

## 2014-05-05 NOTE — Progress Notes (Signed)
   Subjective:    Patient ID: Terry Holt, male    DOB: 1956-02-24, 58 y.o.   MRN: [id number]  HPI Here to recheck a possible hernia in the left groin. We saw him in February for intermittent mild pains in this area but his exam was negative that day. Since then he still has occasional mild pain and he has felt a small bulge at times that he can push back in. 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    Review of Systems  Constitutional: Negative.   Gastrointestinal: Positive for abdominal pain. Negative for nausea, vomiting, diarrhea, constipation, blood in stool, abdominal distention, anal bleeding and rectal pain.  Genitourinary: Negative.        Objective:   Physical Exam  Constitutional: He appears well-developed and well-nourished. No distress.  Abdominal: Soft. Bowel sounds are normal. He exhibits no distension. There is no tenderness. There is no rebound and no guarding.  On straining I can feel a slight bulge in the left inguinal area but no definable masses. This is not tender.           Assessment & Plan:  This is an early inguinal hernia. At this point we will continue to watch it only. Recheck prn

## 2014-05-05 NOTE — Addendum Note (Signed)
Addended by: Aggie Hacker A on: 05/05/2014 04:32 PM   Modules accepted: Orders

## 2014-05-21 ENCOUNTER — Encounter: Payer: Self-pay | Admitting: Internal Medicine

## 2014-07-20 ENCOUNTER — Ambulatory Visit (INDEPENDENT_AMBULATORY_CARE_PROVIDER_SITE_OTHER): Payer: BC Managed Care – PPO | Admitting: Family Medicine

## 2014-07-20 ENCOUNTER — Encounter: Payer: Self-pay | Admitting: Family Medicine

## 2014-07-20 VITALS — BP 130/76 | HR 76 | Temp 98.1°F | Ht 72.75 in | Wt 202.0 lb

## 2014-07-20 DIAGNOSIS — J029 Acute pharyngitis, unspecified: Secondary | ICD-10-CM

## 2014-07-20 NOTE — Progress Notes (Signed)
   Subjective:    Patient ID: Terry Holt, male    DOB: 03-11-1956, 58 y.o.   MRN: [id number]  HPI Here for one month of constant soreness in the right side of the throat. The lymph nodes in the right neck are swollen and tender also. No fever or cough. No trouble swallowing. He is a former smoker but he did not use smokeless tobacco.    Review of Systems  Constitutional: Negative.   HENT: Positive for sore throat. Negative for congestion, ear pain, mouth sores, postnasal drip, sinus pressure, trouble swallowing and voice change.   Eyes: Negative.   Respiratory: Negative.        Objective:   Physical Exam  Constitutional: He appears well-developed and well-nourished.  HENT:  Right Ear: External ear normal.  Left Ear: External ear normal.  Nose: Nose normal.  Mouth/Throat: Oropharynx is clear and moist. No oropharyngeal exudate.  Eyes: Conjunctivae are normal.  Neck: Neck supple.  Several small enlarged tender right AC nodes   Pulmonary/Chest: Effort normal and breath sounds normal.          Assessment & Plan:  The etiology of this throat pain is not clear. We will refer to ENT for a laryngoscopy

## 2014-07-20 NOTE — Progress Notes (Signed)
Pre visit review using our clinic review tool, if applicable. No additional management support is needed unless otherwise documented below in the visit note. 

## 2014-08-20 DIAGNOSIS — G540 Brachial plexus disorders: Secondary | ICD-10-CM

## 2014-08-20 DIAGNOSIS — C801 Malignant (primary) neoplasm, unspecified: Secondary | ICD-10-CM

## 2014-08-20 HISTORY — DX: Brachial plexus disorders: G54.0

## 2014-08-20 HISTORY — DX: Malignant (primary) neoplasm, unspecified: C80.1

## 2014-09-02 ENCOUNTER — Other Ambulatory Visit: Payer: Self-pay | Admitting: Otolaryngology

## 2014-09-02 DIAGNOSIS — R221 Localized swelling, mass and lump, neck: Secondary | ICD-10-CM

## 2014-09-02 DIAGNOSIS — R591 Generalized enlarged lymph nodes: Secondary | ICD-10-CM

## 2014-09-03 ENCOUNTER — Ambulatory Visit
Admission: RE | Admit: 2014-09-03 | Discharge: 2014-09-03 | Disposition: A | Discharge status: Home / Self Care | Payer: PRIVATE HEALTH INSURANCE | Source: Ambulatory Visit | Attending: Otolaryngology | Admitting: Otolaryngology

## 2014-09-03 DIAGNOSIS — R591 Generalized enlarged lymph nodes: Secondary | ICD-10-CM

## 2014-09-03 DIAGNOSIS — R221 Localized swelling, mass and lump, neck: Secondary | ICD-10-CM

## 2014-09-03 MED ORDER — IOHEXOL 300 MG/ML  SOLN
75.0000 mL | Freq: Once | INTRAMUSCULAR | Status: AC | PRN
  Administered 2014-09-03: 75 mL via INTRAVENOUS

## 2014-09-14 ENCOUNTER — Other Ambulatory Visit: Payer: Self-pay | Admitting: Otolaryngology

## 2014-09-21 ENCOUNTER — Other Ambulatory Visit (HOSPITAL_COMMUNITY): Payer: Self-pay | Admitting: Otolaryngology

## 2014-09-21 DIAGNOSIS — C01 Malignant neoplasm of base of tongue: Secondary | ICD-10-CM

## 2014-09-30 ENCOUNTER — Ambulatory Visit (HOSPITAL_COMMUNITY)
Admission: RE | Admit: 2014-09-30 | Discharge: 2014-09-30 | Disposition: A | Discharge status: Home / Self Care | Payer: 59 | Source: Ambulatory Visit | Attending: Otolaryngology | Admitting: Otolaryngology

## 2014-09-30 DIAGNOSIS — C01 Malignant neoplasm of base of tongue: Secondary | ICD-10-CM | POA: Insufficient documentation

## 2014-09-30 DIAGNOSIS — I2510 Atherosclerotic heart disease of native coronary artery without angina pectoris: Secondary | ICD-10-CM | POA: Diagnosis not present

## 2014-09-30 DIAGNOSIS — I6529 Occlusion and stenosis of unspecified carotid artery: Secondary | ICD-10-CM | POA: Diagnosis not present

## 2014-09-30 DIAGNOSIS — J329 Chronic sinusitis, unspecified: Secondary | ICD-10-CM | POA: Insufficient documentation

## 2014-09-30 DIAGNOSIS — C770 Secondary and unspecified malignant neoplasm of lymph nodes of head, face and neck: Secondary | ICD-10-CM | POA: Diagnosis not present

## 2014-09-30 LAB — GLUCOSE, CAPILLARY: Glucose-Capillary: 88 mg/dL (ref 70–99)

## 2014-09-30 MED ORDER — FLUDEOXYGLUCOSE F - 18 (FDG) INJECTION
10.1300 | Freq: Once | INTRAVENOUS | Status: AC | PRN
  Administered 2014-09-30: 10.13 via INTRAVENOUS

## 2014-10-08 ENCOUNTER — Emergency Department (HOSPITAL_COMMUNITY): Payer: 59

## 2014-10-08 ENCOUNTER — Emergency Department (HOSPITAL_COMMUNITY)
Admission: EM | Admit: 2014-10-08 | Discharge: 2014-10-08 | Disposition: A | Discharge status: Home / Self Care | Payer: 59 | Attending: Emergency Medicine | Admitting: Emergency Medicine

## 2014-10-08 ENCOUNTER — Encounter (HOSPITAL_COMMUNITY): Payer: Self-pay | Admitting: Emergency Medicine

## 2014-10-08 DIAGNOSIS — R202 Paresthesia of skin: Secondary | ICD-10-CM | POA: Diagnosis not present

## 2014-10-08 DIAGNOSIS — Z8781 Personal history of (healed) traumatic fracture: Secondary | ICD-10-CM | POA: Insufficient documentation

## 2014-10-08 DIAGNOSIS — I2510 Atherosclerotic heart disease of native coronary artery without angina pectoris: Secondary | ICD-10-CM | POA: Diagnosis not present

## 2014-10-08 DIAGNOSIS — Z8619 Personal history of other infectious and parasitic diseases: Secondary | ICD-10-CM | POA: Diagnosis not present

## 2014-10-08 DIAGNOSIS — Z859 Personal history of malignant neoplasm, unspecified: Secondary | ICD-10-CM | POA: Diagnosis not present

## 2014-10-08 DIAGNOSIS — Z87891 Personal history of nicotine dependence: Secondary | ICD-10-CM | POA: Diagnosis not present

## 2014-10-08 DIAGNOSIS — R200 Anesthesia of skin: Secondary | ICD-10-CM | POA: Diagnosis present

## 2014-10-08 DIAGNOSIS — I10 Essential (primary) hypertension: Secondary | ICD-10-CM | POA: Diagnosis not present

## 2014-10-08 HISTORY — DX: Malignant (primary) neoplasm, unspecified: C80.1

## 2014-10-08 HISTORY — DX: Atherosclerotic heart disease of native coronary artery without angina pectoris: I25.10

## 2014-10-08 LAB — BASIC METABOLIC PANEL
Anion gap: 6 (ref 5–15)
BUN: 17 mg/dL (ref 6–23)
CO2: 28 mmol/L (ref 19–32)
Calcium: 9.1 mg/dL (ref 8.4–10.5)
Chloride: 103 mmol/L (ref 96–112)
Creatinine, Ser: 1.19 mg/dL (ref 0.50–1.35)
GFR calc Af Amer: 77 mL/min — ABNORMAL LOW (ref >90)
GFR calc non Af Amer: 66 mL/min — ABNORMAL LOW (ref >90)
Glucose, Bld: 101 mg/dL — ABNORMAL HIGH (ref 70–99)
Potassium: 4 mmol/L (ref 3.5–5.1)
Sodium: 137 mmol/L (ref 135–145)

## 2014-10-08 LAB — CBC WITH DIFFERENTIAL/PLATELET
Basophils Absolute: 0 10*3/uL (ref 0.0–0.1)
Basophils Relative: 0 % (ref 0–1)
Eosinophils Absolute: 0.2 10*3/uL (ref 0.0–0.7)
Eosinophils Relative: 3 % (ref 0–5)
HCT: 37.1 % — ABNORMAL LOW (ref 39.0–52.0)
Hemoglobin: 12.4 g/dL — ABNORMAL LOW (ref 13.0–17.0)
Lymphocytes Relative: 32 % (ref 12–46)
Lymphs Abs: 2 10*3/uL (ref 0.7–4.0)
MCH: 28 pg (ref 26.0–34.0)
MCHC: 33.4 g/dL (ref 30.0–36.0)
MCV: 83.7 fL (ref 78.0–100.0)
Monocytes Absolute: 0.7 10*3/uL (ref 0.1–1.0)
Monocytes Relative: 12 % (ref 3–12)
Neutro Abs: 3.3 10*3/uL (ref 1.7–7.7)
Neutrophils Relative %: 53 % (ref 43–77)
Platelets: 164 10*3/uL (ref 150–400)
RBC: 4.43 MIL/uL (ref 4.22–5.81)
RDW: 15 % (ref 11.5–15.5)
WBC: 6.2 10*3/uL (ref 4.0–10.5)

## 2014-10-08 MED ORDER — GADOBENATE DIMEGLUMINE 529 MG/ML IV SOLN
19.0000 mL | Freq: Once | INTRAVENOUS | Status: AC | PRN
  Administered 2014-10-08: 19 mL via INTRAVENOUS

## 2014-10-08 MED ORDER — ASPIRIN EC 325 MG PO TBEC
325.0000 mg | DELAYED_RELEASE_TABLET | ORAL | Status: DC
  Filled 2014-10-08: qty 1

## 2014-10-08 NOTE — ED Notes (Signed)
Pt. reports left arm numbness and left facial tingling onset 10 pm this evening , denies chest pain or SOB , speech clear with no facial asymmetry / no arm drift with equal strong grips .

## 2014-10-08 NOTE — ED Notes (Signed)
Pt. Left with all belongings and refused wheelchair 

## 2014-10-08 NOTE — ED Provider Notes (Signed)
CSN: [id number]     Arrival date & time 10/08/14  0867 History  This chart was scribed for Kalman Drape, MD by Rayfield Citizen, ED Scribe. This patient was seen in room A11C/A11C and the patient's care was started at 1:47 AM.    Chief Complaint  Patient presents with  . Numbness   The history is provided by the patient and the spouse. No language interpreter was used.     HPI Comments: Terry Holt is a right-hand dominant 58 y.o. male with past medical history of HTN, CAD who presents to the Emergency Department complaining of gradually worsening left-sided facial numbness, left arm numbness and paresthesias beginning tonight around 22:00. This sensation begins on the outer three digits of the left hand and extends up the front of his left arm to his left shoulder. His facial numbness begins at the hairline and extends down around his left ear; it does not cross the midline of his face or extend to his neck. He took two full-strength aspirin at 22:30. He denies chest pain, SOB, weakness, speech difficulty.   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   He denies a personal or familial history of stroke. Paternal history of afib, quintuple bypass.   Past Medical History  Diagnosis Date  . Chicken pox   . Hypertension   . Concussion 01-2006    resolved  . Fracture     right clavicle,scapula, and finger  . Cancer   . Coronary artery disease    Past Surgical History  Procedure Laterality Date  . Arthroscopic knees      bilateral  . Colonoscopy  05-28-08    per Dr. [name], clear, repeat in 10 yrs    Family History  Problem Relation Age of Onset  . Cancer Other     colon  .  Hypertension Other    History  Substance Use Topics  . Smoking status: Former Research scientist (life sciences)  . Smokeless tobacco: Former Systems developer  . Alcohol Use: 1.8 oz/week    3 Standard drinks or equivalent per week     Comment: occ    Review of Systems  Respiratory: Negative for shortness of breath.   Cardiovascular: Negative for chest pain.  Neurological: Positive for numbness. Negative for speech difficulty and weakness.  All other systems reviewed and are negative.   Allergies  Valsartan  Home Medications   Prior to Admission medications   Not on File   BP 177/94 mmHg  Pulse 74  Temp(Src) 98.4 F (36.9 C) (Oral)  Resp 14  Ht 6\' 1"  (1.854 m)  Wt 197 lb (89.359 kg)  BMI 26.00 kg/m2  SpO2 100% Physical Exam  Constitutional: He is oriented to person, place, and time. He appears well-developed and well-nourished. No distress.  HENT:  Head: Normocephalic and atraumatic.  Right Ear: External ear normal.  Left Ear: External ear normal.  Mouth/Throat: Oropharynx is clear and moist. No oropharyngeal exudate.  Eyes: EOM are normal. Pupils are equal, round, and reactive to light.  Neck: Normal range of motion. Neck supple.  Cardiovascular: Normal rate, regular rhythm, normal heart sounds and intact distal pulses.  Exam reveals no gallop and no friction rub.  No murmur heard. Pulmonary/Chest: Effort normal and breath sounds normal. No respiratory distress. He has no wheezes. He has no rales. He exhibits no tenderness.  Abdominal: Soft. Bowel sounds are normal. He exhibits no distension and no mass. There is no tenderness. There is no rebound and no guarding.  Musculoskeletal: Normal range of motion. He exhibits no edema or tenderness.  Neurological: He is alert and oriented to person, place, and time. He has normal reflexes. He displays normal reflexes. No cranial nerve deficit. He exhibits normal muscle tone. Coordination normal.  Patient has decreased sensation to left cheek and lateral left arm   Skin: Skin is warm and dry. No rash noted. No erythema. No pallor.  Psychiatric: He has a normal mood and affect. His behavior is normal.  Nursing note and vitals reviewed.   ED Course  Procedures   DIAGNOSTIC STUDIES: Oxygen Saturation is 100% on RA, normal by my interpretation.    COORDINATION OF CARE: 1:47 AM Discussed treatment plan with pt at bedside and pt agreed to plan.   Labs Review Labs Reviewed  BASIC METABOLIC PANEL - Abnormal; Notable for the following:    Glucose, Bld 101 (*)    GFR calc non Af Amer 66 (*)    GFR calc Af Amer 77 (*)    All other components within normal limits  CBC WITH DIFFERENTIAL/PLATELET - Abnormal; Notable for the following:    Hemoglobin 12.4 (*)    HCT 37.1 (*)    All other components within normal limits    Imaging Review Mr Jeri Cos Wo Contrast  10/08/2014   CLINICAL DATA:  Initial evaluation for are gradually worsening left-sided facial numbness with left arm numbness and paresthesias. Recently diagnosed with base of tongue cancer.  EXAM: MRI HEAD WITHOUT AND WITH CONTRAST  TECHNIQUE: Multiplanar, multiecho pulse sequences of the brain and surrounding structures were obtained without and with intravenous contrast.  CONTRAST:  28mL MULTIHANCE GADOBENATE DIMEGLUMINE 529 MG/ML IV SOLN  COMPARISON:  Prior neck CT from 09/03/2014.  FINDINGS: The CSF containing spaces are within normal limits for patient age. Single T2/FLAIR hyperintensity noted within the deep white matter of the posterior right frontal lobe (Series 7, image 18), nonspecific.  No mass lesion, midline shift, or extra-axial fluid collection. Ventricles are normal in size without evidence of hydrocephalus.  No diffusion-weighted signal abnormality is identified to suggest acute intracranial infarct. Gray-white matter differentiation is maintained. Normal flow voids are seen within the intracranial vasculature. No intracranial hemorrhage identified.  Cerebellar tonsils are minimally low  lying by approximately 2 mm without frank Chiari malformation. Craniocervical junction otherwise unremarkable. Pituitary gland is within normal limits. Pituitary stalk is midline. The globes and optic nerves demonstrate a normal appearance with normal signal intensity.  No abnormal enhancement.  The bone marrow signal intensity is normal. Calvarium is intact. Visualized upper cervical spine is within normal limits.  Scalp soft tissues are unremarkable.  Right maxillary sinus is completely opacified with near complete opacification of the left maxillary sinus. Ethmoidal air cells are largely opacified as well. There is mucosal thickening within the sphenoid sinuses bilaterally. Frontal sinuses are underpneumatized. No mastoid effusion.  Patient's known right base of tongue lesion not visualized on this exam.  IMPRESSION: 1. Normal brain MRI. No acute intracranial infarct or mass lesion identified. 2. Pansinusitis, likely inflammatory in nature.   Electronically Signed   By: Jeannine Boga M.D.   On: 10/08/2014 05:36     EKG Interpretation   Date/Time:  Friday  October 08 2014 01:11:22 EST Ventricular Rate:  68 PR Interval:  176 QRS Duration: 98 QT Interval:  416 QTC Calculation: 442 R Axis:   -31 Text Interpretation:  Normal sinus rhythm Left axis deviation Abnormal ECG  No significant change since last tracing Confirmed by Ryelan Kazee  MD, Emryn Flanery  ([id number]) on 10/08/2014 1:47:10 AM      MDM   Final diagnoses:  Paresthesia    I personally performed the services described in this documentation, which was scribed in my presence. The recorded information has been reviewed and is accurate.  59 year old male with paresthesias to left cheek and left arm.  Patient with recent diagnosis of right base of tongue cancer, they're noted.  Right internal carotid.  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  Case briefly discussed with neurology who  recommends MRI with contrast for further workup  MRI unremarkable.  Patient reassured, will follow up with his specialist at [facility], MD 10/08/14 [phone]

## 2014-10-08 NOTE — Discharge Instructions (Signed)

## 2015-02-22 ENCOUNTER — Encounter: Payer: Self-pay | Admitting: Family Medicine

## 2015-02-22 DIAGNOSIS — K4090 Unilateral inguinal hernia, without obstruction or gangrene, not specified as recurrent: Secondary | ICD-10-CM

## 2015-02-22 NOTE — Telephone Encounter (Signed)
I referred him to Surgery to evaluate

## 2015-02-23 NOTE — Telephone Encounter (Signed)
I did the referral yesterday

## 2015-03-25 ENCOUNTER — Ambulatory Visit: Payer: Self-pay | Admitting: General Surgery

## 2015-03-25 NOTE — H&P (Signed)
Terry Holt 03/10/2015 3:00 PM Location: [facility] Patient #: [id number] DOB: 1955-10-23 Married / Language: English / Race: White Male  History of Present Illness Terry Holt M. Terry Hoos Holt; 03/11/2015 6:20 PM) The patient is a 58 year old male who presents with an inguinal hernia. He is referred by Dr. Sarajane Jews for evaluation of a left inguinal hernia. He states that he initially had some left groin discomfort last fall. He noticed a small bulge at that time. However unfortunately he was found to have tongue cancer. He has gone through 30 rounds of radiation and 6 rounds of chemotherapy for his tongue cancer. He finished his chemotherapy in early April. This is being managed at [facility]. He has a PET scan this coming Monday. His inguinal hernia is becoming more noticeable. It does cause some discomfort at time. It is now starting to interfere with his activity. He denies any dysuria. He does have some nocturia. He denies any chest pain, chest pressure, shortness of breath, dyspnea on exertion, TIAs or amaurosis fugax. He denies any prior abdominal surgery.   Problem List/Past Medical Terry Holt Ronnie Derby, Holt; 03/11/2015 6:22 PM) LEFT INGUINAL HERNIA (550.90  K40.90)  Other Problems Terry Holt; 03/11/2015 6:22 PM) Cancer High blood pressure Inguinal Hernia  Past Surgical History Terry Holt; 03/10/2015 2:44 PM) Knee Surgery Bilateral. Vasectomy  Diagnostic Studies History Terry Holt; 03/10/2015 2:44 PM) Colonoscopy 5-10 years ago  Allergies Terry Holt; 03/10/2015 2:45 PM) Valsartan *ANTIHYPERTENSIVES*  Medication History Terry Holt; 03/11/2015 6:22 PM) Aspirin Childrens (81MG  Tablet Chewable, Oral) Active. Medications Reconciled Simvastatin (20MG  Tablet, Oral) Active. Flomax (0.4MG  Capsule, 1 (one) Capsule Oral daily, Taken starting 03/10/2015) Active. (start taking 2 weeks before surgery)  Social History Terry Holt;  03/10/2015 2:44 PM) Alcohol use Occasional alcohol use. Caffeine use Carbonated beverages, Coffee, Tea. No drug use Tobacco use Former smoker.  Family History Terry Holt; 03/10/2015 2:44 PM) Heart Disease Father. Hypertension Father. Melanoma Brother, Father. Respiratory Condition Mother.  Review of Systems Terry Holt; 03/10/2015 2:44 PM) General Not Present- Appetite Loss, Chills, Fatigue, Fever, Night Sweats, Weight Gain and Weight Loss. Skin Not Present- Change in Wart/Mole, Dryness, Hives, Jaundice, New Lesions, Non-Healing Wounds, Rash and Ulcer. HEENT Present- Ringing in the Ears. Not Present- Earache, Hearing Loss, Hoarseness, Nose Bleed, Oral Ulcers, Seasonal Allergies, Sinus Pain, Sore Throat, Visual Disturbances, Wears glasses/contact lenses and Yellow Eyes. Respiratory Present- Snoring. Not Present- Bloody sputum, Chronic Cough, Difficulty Breathing and Wheezing. Breast Not Present- Breast Mass, Breast Pain, Nipple Discharge and Skin Changes. Cardiovascular Not Present- Chest Pain, Difficulty Breathing Lying Down, Leg Cramps, Palpitations, Rapid Heart Rate, Shortness of Breath and Swelling of Extremities. Gastrointestinal Present- Abdominal Pain. Not Present- Bloating, Bloody Stool, Change in Bowel Habits, Chronic diarrhea, Constipation, Difficulty Swallowing, Excessive gas, Gets full quickly at meals, Hemorrhoids, Indigestion, Nausea, Rectal Pain and Vomiting. Male Genitourinary Not Present- Blood in Urine, Change in Urinary Stream, Frequency, Impotence, Nocturia, Painful Urination, Urgency and Urine Leakage. Musculoskeletal Not Present- Back Pain, Joint Pain, Joint Stiffness, Muscle Pain, Muscle Weakness and Swelling of Extremities. Neurological Not Present- Decreased Memory, Fainting, Headaches, Numbness, Seizures, Tingling, Tremor, Trouble walking and Weakness. Psychiatric Not Present- Anxiety, Bipolar, Change in Sleep Pattern, Depression, Fearful and Frequent  crying. Endocrine Not Present- Cold Intolerance, Excessive Hunger, Hair Changes, Heat Intolerance, Hot flashes and New Diabetes. Hematology Not Present- Easy Bruising, Excessive bleeding, Gland problems, HIV and Persistent Infections.   Vitals Terry Holt; 03/10/2015 2:45  PM) 03/10/2015 2:44 PM Weight: 190 lb Height: 73in Body Surface Area: 2.11 m Body Mass Index: 25.07 kg/m Temp.: 92F(Temporal)  Pulse: 81 (Regular)  BP: 140/82 (Sitting, Left Arm, Standard)    Physical Exam Terry Holt M. Jacelynn Hayton Holt; 03/11/2015 6:17 PM) General Mental Status-Alert. General Appearance-Consistent with stated age. Hydration-Well hydrated. Voice-Normal.  Head and Neck Head-normocephalic, atraumatic with no lesions or palpable masses. Trachea-midline. Thyroid Gland Characteristics - normal size and consistency.  Eye Eyeball - Bilateral-Extraocular movements intact. Sclera/Conjunctiva - Bilateral-No scleral icterus.  Chest and Lung Exam Chest and lung exam reveals -quiet, even and easy respiratory effort with no use of accessory muscles and on auscultation, normal breath sounds, no adventitious sounds and normal vocal resonance. Inspection Chest Wall - Normal. Back - normal.  Breast - Did not examine.  Cardiovascular Cardiovascular examination reveals -normal heart sounds, regular rate and rhythm with no murmurs and normal pedal pulses bilaterally.  Abdomen Inspection Inspection of the abdomen reveals - No Hernias. Skin - Scar - no surgical scars. Palpation/Percussion Palpation and Percussion of the abdomen reveal - Soft, Non Tender, No Rebound tenderness, No Rigidity (guarding) and No hepatosplenomegaly. Auscultation Auscultation of the abdomen reveals - Bowel sounds normal.  Male Genitourinary Note: Patient examined supine and standing with and without Valsalva. No evidence of right inguinal hernia. Both testicles down. No scrotal masses. Reducible left  inguinal hernia it is slightly lateral. It is more consistent with an indirect hernia. Nontender   Peripheral Vascular Upper Extremity Palpation - Pulses bilaterally normal.  Neurologic Neurologic evaluation reveals -alert and oriented x 3 with no impairment of recent or remote memory. Mental Status-Normal.  Neuropsychiatric The patient's mood and affect are described as -normal. Judgment and Insight-insight is appropriate concerning matters relevant to self.  Musculoskeletal Normal Exam - Left-Upper Extremity Strength Normal and Lower Extremity Strength Normal. Normal Exam - Right-Upper Extremity Strength Normal and Lower Extremity Strength Normal.  Lymphatic Head & Neck  General Head & Neck Lymphatics: Bilateral - Description - Normal. Axillary - Did not examine. Femoral & Inguinal - Did not examine.    Assessment & Plan Terry Holt M. Sky Borboa Holt; 03/11/2015 6:21 PM) LEFT INGUINAL HERNIA (550.90  K40.90) Impression: We discussed the etiology of inguinal hernias. We discussed the signs & symptoms of incarceration & strangulation. We discussed non-operative and operative management.  The patient has elected to proceed with laparoscopic repair of left inguinal hernia with mesh (assuming his PET scan is negative)  I described the procedure in detail. The patient was given educational material. We discussed the risks and benefits including but not limited to bleeding, infection, chronic inguinal pain, nerve entrapment, hernia recurrence, mesh complications, hematoma formation, urinary retention, injury to the testicles, numbness in the groin, blood clots, injury to the surrounding structures, and anesthesia risk. We also discussed the typical post operative recovery course, including no heavy lifting for 4-6 weeks. I explained that the likelihood of improvement of their symptoms is good  Our office will contact him next week to schedule surgery at that time he will let us know  the results of his PET scan.  Because of some of his BPH symptoms we will place him on perioperative Flomax to decrease his risk of postoperative urinary retention Current Plans  Schedule for Surgery Pt Education - Pamphlet Given - Laparoscopic Hernia Repair: discussed with patient and provided information. Started Flomax 0.4MG , 1 (one) Capsule daily, #21, 21 days starting 03/10/2015, No Refill. Local Order: start taking 2 weeks before surgery  Leighton Ruff.  Redmond Pulling, Holt, FACS General, Bariatric, & Minimally Invasive Surgery [facility], [place]

## 2015-04-04 ENCOUNTER — Ambulatory Visit: Payer: Self-pay | Admitting: General Surgery

## 2015-04-28 ENCOUNTER — Encounter (HOSPITAL_BASED_OUTPATIENT_CLINIC_OR_DEPARTMENT_OTHER): Payer: Self-pay | Admitting: *Deleted

## 2015-04-29 ENCOUNTER — Encounter (HOSPITAL_BASED_OUTPATIENT_CLINIC_OR_DEPARTMENT_OTHER)
Admission: RE | Admit: 2015-04-29 | Discharge: 2015-04-29 | Disposition: A | Discharge status: Home / Self Care | Payer: 59 | Source: Ambulatory Visit | Attending: General Surgery | Admitting: General Surgery

## 2015-04-29 DIAGNOSIS — Z87891 Personal history of nicotine dependence: Secondary | ICD-10-CM | POA: Diagnosis not present

## 2015-04-29 DIAGNOSIS — Z79899 Other long term (current) drug therapy: Secondary | ICD-10-CM | POA: Diagnosis not present

## 2015-04-29 DIAGNOSIS — I10 Essential (primary) hypertension: Secondary | ICD-10-CM | POA: Diagnosis not present

## 2015-04-29 DIAGNOSIS — Z7982 Long term (current) use of aspirin: Secondary | ICD-10-CM | POA: Diagnosis not present

## 2015-04-29 DIAGNOSIS — K4090 Unilateral inguinal hernia, without obstruction or gangrene, not specified as recurrent: Secondary | ICD-10-CM | POA: Diagnosis not present

## 2015-04-29 LAB — COMPREHENSIVE METABOLIC PANEL
ALT: 20 U/L (ref 17–63)
AST: 42 U/L — ABNORMAL HIGH (ref 15–41)
Albumin: 4.2 g/dL (ref 3.5–5.0)
Alkaline Phosphatase: 50 U/L (ref 38–126)
Anion gap: 8 (ref 5–15)
BUN: 25 mg/dL — ABNORMAL HIGH (ref 6–20)
CO2: 30 mmol/L (ref 22–32)
Calcium: 9.8 mg/dL (ref 8.9–10.3)
Chloride: 99 mmol/L — ABNORMAL LOW (ref 101–111)
Creatinine, Ser: 1.03 mg/dL (ref 0.61–1.24)
GFR calc Af Amer: >60 mL/min (ref >60)
GFR calc non Af Amer: >60 mL/min (ref >60)
Glucose, Bld: 80 mg/dL (ref 65–99)
Potassium: 4.1 mmol/L (ref 3.5–5.1)
Sodium: 137 mmol/L (ref 135–145)
Total Bilirubin: 0.9 mg/dL (ref 0.3–1.2)
Total Protein: 6.9 g/dL (ref 6.5–8.1)

## 2015-04-29 LAB — CBC WITH DIFFERENTIAL/PLATELET
Basophils Absolute: 0 10*3/uL (ref 0.0–0.1)
Basophils Relative: 0 % (ref 0–1)
Eosinophils Absolute: 0.1 10*3/uL (ref 0.0–0.7)
Eosinophils Relative: 2 % (ref 0–5)
HCT: 41.2 % (ref 39.0–52.0)
Hemoglobin: 14.5 g/dL (ref 13.0–17.0)
Lymphocytes Relative: 14 % (ref 12–46)
Lymphs Abs: 0.8 10*3/uL (ref 0.7–4.0)
MCH: 33 pg (ref 26.0–34.0)
MCHC: 35.2 g/dL (ref 30.0–36.0)
MCV: 93.6 fL (ref 78.0–100.0)
Monocytes Absolute: 0.6 10*3/uL (ref 0.1–1.0)
Monocytes Relative: 12 % (ref 3–12)
Neutro Abs: 3.9 10*3/uL (ref 1.7–7.7)
Neutrophils Relative %: 72 % (ref 43–77)
Platelets: 162 10*3/uL (ref 150–400)
RBC: 4.4 MIL/uL (ref 4.22–5.81)
RDW: 12.9 % (ref 11.5–15.5)
WBC: 5.4 10*3/uL (ref 4.0–10.5)

## 2015-05-05 ENCOUNTER — Ambulatory Visit (HOSPITAL_BASED_OUTPATIENT_CLINIC_OR_DEPARTMENT_OTHER): Payer: 59 | Admitting: Anesthesiology

## 2015-05-05 ENCOUNTER — Encounter (HOSPITAL_BASED_OUTPATIENT_CLINIC_OR_DEPARTMENT_OTHER): Payer: Self-pay | Admitting: *Deleted

## 2015-05-05 ENCOUNTER — Ambulatory Visit (HOSPITAL_BASED_OUTPATIENT_CLINIC_OR_DEPARTMENT_OTHER)
Admission: RE | Admit: 2015-05-05 | Discharge: 2015-05-05 | Disposition: A | Discharge status: Home / Self Care | Payer: 59 | Source: Ambulatory Visit | Attending: General Surgery | Admitting: General Surgery

## 2015-05-05 ENCOUNTER — Encounter (HOSPITAL_BASED_OUTPATIENT_CLINIC_OR_DEPARTMENT_OTHER)
Admission: RE | Disposition: A | Discharge status: Home / Self Care | Payer: Self-pay | Source: Ambulatory Visit | Attending: General Surgery

## 2015-05-05 DIAGNOSIS — K4090 Unilateral inguinal hernia, without obstruction or gangrene, not specified as recurrent: Secondary | ICD-10-CM | POA: Insufficient documentation

## 2015-05-05 DIAGNOSIS — Z87891 Personal history of nicotine dependence: Secondary | ICD-10-CM | POA: Insufficient documentation

## 2015-05-05 DIAGNOSIS — Z79899 Other long term (current) drug therapy: Secondary | ICD-10-CM | POA: Insufficient documentation

## 2015-05-05 DIAGNOSIS — I10 Essential (primary) hypertension: Secondary | ICD-10-CM | POA: Insufficient documentation

## 2015-05-05 DIAGNOSIS — Z7982 Long term (current) use of aspirin: Secondary | ICD-10-CM | POA: Insufficient documentation

## 2015-05-05 HISTORY — PX: INSERTION OF MESH: SHX5868

## 2015-05-05 HISTORY — PX: INGUINAL HERNIA REPAIR: SHX194

## 2015-05-05 SURGERY — REPAIR, HERNIA, INGUINAL, LAPAROSCOPIC
Anesthesia: General | Laterality: Left

## 2015-05-05 MED ORDER — METOCLOPRAMIDE HCL 5 MG/ML IJ SOLN
INTRAMUSCULAR | Status: AC
  Filled 2015-05-05: qty 2

## 2015-05-05 MED ORDER — FENTANYL CITRATE (PF) 100 MCG/2ML IJ SOLN
50.0000 ug | INTRAMUSCULAR | Status: AC | PRN
  Administered 2015-05-05 (×2): 50 ug via INTRAVENOUS
  Administered 2015-05-05: 100 ug via INTRAVENOUS

## 2015-05-05 MED ORDER — HYDROMORPHONE HCL 1 MG/ML IJ SOLN
INTRAMUSCULAR | Status: AC
  Filled 2015-05-05: qty 1

## 2015-05-05 MED ORDER — SCOPOLAMINE 1 MG/3DAYS TD PT72
1.0000 | MEDICATED_PATCH | Freq: Once | TRANSDERMAL | Status: DC | PRN
  Administered 2015-05-05: 1.5 mg via TRANSDERMAL

## 2015-05-05 MED ORDER — ONDANSETRON HCL 4 MG/2ML IJ SOLN
INTRAMUSCULAR | Status: AC
  Filled 2015-05-05: qty 2

## 2015-05-05 MED ORDER — FENTANYL CITRATE (PF) 100 MCG/2ML IJ SOLN
INTRAMUSCULAR | Status: AC
  Filled 2015-05-05: qty 4

## 2015-05-05 MED ORDER — CEFAZOLIN SODIUM-DEXTROSE 2-3 GM-% IV SOLR
2.0000 g | INTRAVENOUS | Status: AC
  Administered 2015-05-05: 2 g via INTRAVENOUS

## 2015-05-05 MED ORDER — METOCLOPRAMIDE HCL 5 MG/ML IJ SOLN
INTRAMUSCULAR | Status: DC | PRN
  Administered 2015-05-05: 10 mg via INTRAVENOUS

## 2015-05-05 MED ORDER — SCOPOLAMINE 1 MG/3DAYS TD PT72
MEDICATED_PATCH | TRANSDERMAL | Status: AC
  Filled 2015-05-05: qty 1

## 2015-05-05 MED ORDER — SODIUM CHLORIDE 0.9 % IV SOLN
INTRAVENOUS | Status: DC | PRN
  Administered 2015-05-05: 50 mL

## 2015-05-05 MED ORDER — LACTATED RINGERS IV SOLN
INTRAVENOUS | Status: DC
  Administered 2015-05-05: 09:00:00 via INTRAVENOUS

## 2015-05-05 MED ORDER — PROMETHAZINE HCL 25 MG/ML IJ SOLN: 6.2500 mg | INTRAMUSCULAR | Status: DC | PRN

## 2015-05-05 MED ORDER — BUPIVACAINE-EPINEPHRINE 0.25% -1:200000 IJ SOLN
INTRAMUSCULAR | Status: DC | PRN
  Administered 2015-05-05: 5 mL

## 2015-05-05 MED ORDER — LIDOCAINE HCL (CARDIAC) 20 MG/ML IV SOLN
INTRAVENOUS | Status: DC | PRN
  Administered 2015-05-05: 80 mg via INTRAVENOUS

## 2015-05-05 MED ORDER — ONDANSETRON HCL 4 MG/2ML IJ SOLN
INTRAMUSCULAR | Status: DC | PRN
  Administered 2015-05-05: 4 mg via INTRAVENOUS

## 2015-05-05 MED ORDER — CEFAZOLIN SODIUM-DEXTROSE 2-3 GM-% IV SOLR
INTRAVENOUS | Status: AC
  Filled 2015-05-05: qty 50

## 2015-05-05 MED ORDER — PROPOFOL 10 MG/ML IV BOLUS
INTRAVENOUS | Status: DC | PRN
  Administered 2015-05-05: 150 mg via INTRAVENOUS

## 2015-05-05 MED ORDER — MIDAZOLAM HCL 2 MG/2ML IJ SOLN
1.0000 mg | INTRAMUSCULAR | Status: DC | PRN
  Administered 2015-05-05: 2 mg via INTRAVENOUS

## 2015-05-05 MED ORDER — DEXAMETHASONE SODIUM PHOSPHATE 10 MG/ML IJ SOLN
INTRAMUSCULAR | Status: AC
  Filled 2015-05-05: qty 1

## 2015-05-05 MED ORDER — EPHEDRINE SULFATE 50 MG/ML IJ SOLN
INTRAMUSCULAR | Status: DC | PRN
  Administered 2015-05-05: 10 mg via INTRAVENOUS

## 2015-05-05 MED ORDER — CHLORHEXIDINE GLUCONATE 4 % EX LIQD: 1.0000 "application " | Freq: Once | CUTANEOUS | Status: DC

## 2015-05-05 MED ORDER — MIDAZOLAM HCL 2 MG/2ML IJ SOLN
INTRAMUSCULAR | Status: AC
  Filled 2015-05-05: qty 4

## 2015-05-05 MED ORDER — ROCURONIUM BROMIDE 100 MG/10ML IV SOLN
INTRAVENOUS | Status: DC | PRN
  Administered 2015-05-05: 10 mg via INTRAVENOUS
  Administered 2015-05-05: 40 mg via INTRAVENOUS

## 2015-05-05 MED ORDER — HYDROMORPHONE HCL 1 MG/ML IJ SOLN: 0.2500 mg | INTRAMUSCULAR | Status: DC | PRN

## 2015-05-05 MED ORDER — GLYCOPYRROLATE 0.2 MG/ML IJ SOLN
0.2000 mg | Freq: Once | INTRAMUSCULAR | Status: AC | PRN
  Administered 2015-05-05: .4 mg via INTRAVENOUS

## 2015-05-05 MED ORDER — DEXAMETHASONE SODIUM PHOSPHATE 4 MG/ML IJ SOLN
INTRAMUSCULAR | Status: DC | PRN
  Administered 2015-05-05: 10 mg via INTRAVENOUS

## 2015-05-05 MED ORDER — OXYCODONE HCL 5 MG PO TABS: 5.0000 mg | ORAL_TABLET | ORAL | Status: DC | PRN

## 2015-05-05 MED ORDER — NEOSTIGMINE METHYLSULFATE 10 MG/10ML IV SOLN
INTRAVENOUS | Status: DC | PRN
Start: 2015-05-05 — End: 2015-05-05
  Administered 2015-05-05: 3 mg via INTRAVENOUS

## 2015-05-05 SURGICAL SUPPLY — 55 items
APPLIER CLIP 5 13 M/L LIGAMAX5 (MISCELLANEOUS)
APPLIER CLIP ROT 10 11.4 M/L (STAPLE)
BANDAGE ADH SHEER 1  50/CT (GAUZE/BANDAGES/DRESSINGS) IMPLANT
BENZOIN TINCTURE PRP APPL 2/3 (GAUZE/BANDAGES/DRESSINGS) IMPLANT
BLADE CLIPPER SURG (BLADE) ×3 IMPLANT
CHLORAPREP W/TINT 26ML (MISCELLANEOUS) ×3 IMPLANT
CLIP APPLIE 5 13 M/L LIGAMAX5 (MISCELLANEOUS) IMPLANT
CLIP APPLIE ROT 10 11.4 M/L (STAPLE) IMPLANT
CLOSURE WOUND 1/2 X4 (GAUZE/BANDAGES/DRESSINGS)
COVER MAYO STAND STRL (DRAPES) ×3 IMPLANT
COVER SURGICAL LIGHT HANDLE (MISCELLANEOUS) ×3 IMPLANT
DECANTER SPIKE VIAL GLASS SM (MISCELLANEOUS) IMPLANT
DEVICE SECURE STRAP 25 ABSORB (INSTRUMENTS) ×3 IMPLANT
DRAPE INCISE IOBAN 66X45 STRL (DRAPES) IMPLANT
DRAPE LAPAROSCOPIC ABDOMINAL (DRAPES) IMPLANT
DRSG TEGADERM 2-3/8X2-3/4 SM (GAUZE/BANDAGES/DRESSINGS) IMPLANT
DRSG TEGADERM 4X4.75 (GAUZE/BANDAGES/DRESSINGS) IMPLANT
ELECT REM PT RETURN 9FT ADLT (ELECTROSURGICAL) ×3
ELECTRODE REM PT RTRN 9FT ADLT (ELECTROSURGICAL) ×1 IMPLANT
FILTER SMOKE EVAC LAPAROSHD (FILTER) IMPLANT
GLOVE BIO SURGEON STRL SZ7.5 (GLOVE) ×3 IMPLANT
GLOVE BIOGEL PI IND STRL 8 (GLOVE) ×1 IMPLANT
GLOVE BIOGEL PI INDICATOR 8 (GLOVE) ×2
GOWN STRL REUS W/ TWL LRG LVL3 (GOWN DISPOSABLE) ×1 IMPLANT
GOWN STRL REUS W/TWL LRG LVL3 (GOWN DISPOSABLE) ×2
GOWN STRL REUS W/TWL XL LVL3 (GOWN DISPOSABLE) ×3 IMPLANT
LIQUID BAND (GAUZE/BANDAGES/DRESSINGS) ×3 IMPLANT
MESH ULTRAPRO 3X6 7.6X15CM (Mesh General) ×3 IMPLANT
NS IRRIG 1000ML POUR BTL (IV SOLUTION) ×3 IMPLANT
PACK BASIN DAY SURGERY FS (CUSTOM PROCEDURE TRAY) ×3 IMPLANT
RELOAD STAPLE HERNIA 4.0 BLUE (INSTRUMENTS) IMPLANT
RELOAD STAPLE HERNIA 4.8 BLK (STAPLE) IMPLANT
SCALPEL HARMONIC ACE (MISCELLANEOUS) IMPLANT
SCISSORS LAP 5X35 DISP (ENDOMECHANICALS) IMPLANT
SET IRRIG TUBING LAPAROSCOPIC (IRRIGATION / IRRIGATOR) IMPLANT
SLEEVE ENDOPATH XCEL 5M (ENDOMECHANICALS) ×6 IMPLANT
SLEEVE SCD COMPRESS KNEE MED (MISCELLANEOUS) ×3 IMPLANT
SOLUTION ANTI FOG 6CC (MISCELLANEOUS) ×3 IMPLANT
SPECIMEN JAR SMALL (MISCELLANEOUS) ×3 IMPLANT
SPONGE GAUZE 2X2 8PLY STER LF (GAUZE/BANDAGES/DRESSINGS) ×1
SPONGE GAUZE 2X2 8PLY STRL LF (GAUZE/BANDAGES/DRESSINGS) ×2 IMPLANT
STAPLER HERNIA 12 8.5 360D (INSTRUMENTS) IMPLANT
STRIP CLOSURE SKIN 1/2X4 (GAUZE/BANDAGES/DRESSINGS) IMPLANT
SUT MNCRL AB 4-0 PS2 18 (SUTURE) ×3 IMPLANT
SUT VICRYL 0 UR6 27IN ABS (SUTURE) ×6 IMPLANT
TOWEL OR 17X24 6PK STRL BLUE (TOWEL DISPOSABLE) ×3 IMPLANT
TOWEL OR NON WOVEN STRL DISP B (DISPOSABLE) ×3 IMPLANT
TRAY FOLEY CATH SILVER 16FR (SET/KITS/TRAYS/PACK) IMPLANT
TRAY LAPAROSCOPIC (CUSTOM PROCEDURE TRAY) ×3 IMPLANT
TROCAR BLADELESS OPT 5 75 (ENDOMECHANICALS) ×3 IMPLANT
TROCAR SLEEVE XCEL 5X75 (ENDOMECHANICALS) ×3 IMPLANT
TROCAR XCEL BLUNT TIP 100MML (ENDOMECHANICALS) ×3 IMPLANT
TROCAR XCEL NON-BLD 5MMX100MML (ENDOMECHANICALS) ×3 IMPLANT
TUBING INSUFFLATION (TUBING) ×3 IMPLANT
TUBING INSUFFLATION 10FT LAP (TUBING) ×3 IMPLANT

## 2015-05-05 NOTE — Anesthesia Postprocedure Evaluation (Signed)
Anesthesia Post Note  Patient: Terry Holt  Procedure(s) Performed: Procedure(s) (LRB): LAPAROSCOPIC INGUINAL HERNIA (Left) INSERTION OF MESH (Left)  Anesthesia type: general  Patient location: PACU  Post pain: Pain level controlled  Post assessment: Patient's Cardiovascular Status Stable  Last Vitals:  Filed Vitals:   05/05/15 1145  BP: 144/82  Pulse: 73  Temp:   Resp: 19    Post vital signs: Reviewed and stable  Level of consciousness: sedated  Complications: No apparent anesthesia complications

## 2015-05-05 NOTE — Anesthesia Preprocedure Evaluation (Addendum)
Anesthesia Evaluation  Patient identified by MRN, date of birth, ID band Patient awake    Reviewed: Allergy & Precautions, NPO status , Patient's Chart, lab work & pertinent test results  Airway Mallampati: II  TM Distance: >3 FB Neck ROM: Full    Dental no notable dental hx.    Pulmonary neg pulmonary ROS, former smoker,    Pulmonary exam normal breath sounds clear to auscultation       Cardiovascular hypertension, Normal cardiovascular exam Rhythm:Regular Rate:Normal     Neuro/Psych negative neurological ROS  negative psych ROS   GI/Hepatic negative GI ROS, Neg liver ROS,   Endo/Other  negative endocrine ROS  Renal/GU negative Renal ROS  negative genitourinary   Musculoskeletal negative musculoskeletal ROS (+)   Abdominal   Peds negative pediatric ROS (+)  Hematology negative hematology ROS (+)   Anesthesia Other Findings   Reproductive/Obstetrics negative OB ROS                             Anesthesia Physical Anesthesia Plan  ASA: II  Anesthesia Plan: General   Post-op Pain Management:    Induction: Intravenous  Airway Management Planned: Oral ETT  Additional Equipment:   Intra-op Plan:   Post-operative Plan: Extubation in OR  Informed Consent: I have reviewed the patients History and Physical, chart, labs and discussed the procedure including the risks, benefits and alternatives for the proposed anesthesia with the patient or authorized representative who has indicated his/her understanding and acceptance.   Dental advisory given  Plan Discussed with: CRNA and Surgeon  Anesthesia Plan Comments:         Anesthesia Quick Evaluation  

## 2015-05-05 NOTE — Anesthesia Procedure Notes (Signed)
Procedure Name: Intubation Date/Time: 05/05/2015 9:13 AM Performed by: Maryella Shivers Pre-anesthesia Checklist: Patient identified, Emergency Drugs available, Suction available and Patient being monitored Patient Re-evaluated:Patient Re-evaluated prior to inductionOxygen Delivery Method: Circle System Utilized Preoxygenation: Pre-oxygenation with 100% oxygen Intubation Type: IV induction Ventilation: Mask ventilation without difficulty Laryngoscope Size: Mac and 4 Grade View: Grade II Tube type: Oral Tube size: 8.0 mm Number of attempts: 1 Airway Equipment and Method: Stylet and Oral airway Placement Confirmation: ETT inserted through vocal cords under direct vision,  positive ETCO2 and breath sounds checked- equal and bilateral Secured at: 22 cm Tube secured with: Tape Dental Injury: Teeth and Oropharynx as per pre-operative assessment

## 2015-05-05 NOTE — Interval H&P Note (Signed)
History and Physical Interval Note:  05/05/2015 8:53 AM  Terry Holt  has presented today for surgery, with the diagnosis of inguinal hernia  The various methods of treatment have been discussed with the patient and family. After consideration of risks, benefits and other options for treatment, the patient has consented to  Procedure(s): LAPAROSCOPIC INGUINAL HERNIA (Left) INSERTION OF MESH (Left) as a surgical intervention .  The patient's history has been reviewed, patient examined, no change in status, stable for surgery.  I have reviewed the patient's chart and labs.  Questions were answered to the patient's satisfaction.  Leighton Ruff. Redmond Pulling, MD, Ama, Bariatric, & Minimally Invasive Surgery [facility], [place]      [facility]

## 2015-05-05 NOTE — Transfer of Care (Signed)
Immediate Anesthesia Transfer of Care Note  Patient: Terry Holt  Procedure(s) Performed: Procedure(s): LAPAROSCOPIC INGUINAL HERNIA (Left) INSERTION OF MESH (Left)  Patient Location: PACU  Anesthesia Type:General  Level of Consciousness: awake, alert  and oriented  Airway & Oxygen Therapy: Patient Spontanous Breathing and Patient connected to face mask oxygen  Post-op Assessment: Report given to RN and Post -op Vital signs reviewed and stable  Post vital signs: Reviewed and stable  Last Vitals:  Filed Vitals:   05/05/15 0741  BP: 155/94  Pulse: 84  Temp: 36.4 C  Resp: 20    Complications: No apparent anesthesia complications

## 2015-05-05 NOTE — Op Note (Signed)
05/05/2015  Terry Holt 03/23/1956   PREOPERATIVE DIAGNOSIS: left inguinal hernia.   POSTOPERATIVE DIAGNOSIS: left indirect inguinal hernia.   PROCEDURE: Laparoscopic repair of left indirect inguinal hernia with  mesh (TAPP).   SURGEON: Leighton Ruff. Redmond Pulling, MD   ASSISTANT SURGEON: None.   ANESTHESIA: General plus local consisting of 0.25% Marcaine with epi. & 50cc exparel  ESTIMATED BLOOD LOSS: Minimal.   FINDINGS: The patient had a left indirect inguinal hernia.  It was repaired using a 3 inch x 6  inch piece of Ethicon UltraPro mesh.   SPECIMEN: none  INDICATIONS FOR PROCEDURE: 59 yo WM with symptomatic left inguinal hernia who desired repair.  The risks and benefits including but not limited to bleeding, infection, chronic inguinal pain, nerve entrapment, hernia recurrence, mesh complications, hematoma formation, urinary retention, injury to the testicles or the ovaries, numbness in the groin, blood clots, injury to the surrounding structures, and anesthesia risk was discussed with the patient.  DESCRIPTION OF PROCEDURE: After obtaining verbal consent and marking  the left groin in the holding area with the patient confirming the  operative site, the patient was then taken back to the operating room, placed  supine on the operating room table. General endotracheal anesthesia was  established. The patient had emptied their bladder prior to going back to  the operating room. Sequential compression devices were placed. The  abdomen and groin were prepped and draped in the usual standard surgical  fashion with ChloraPrep. The patient received  IV  antibiotics prior to the incision. A surgical time-out was performed.  Local was infiltrated at the base of the umbilicus.   Next, a 1-cm vertical infraumbilical incision was made with a #11 blade. The fascia  was grasped and lifted anteriorly. Next, the fascia was incised, and  the abdominal cavity was entered. Pursestring suture  was placed around  the fascial edges using a 0 Vicryl. A 12-mm Hasson trocar was placed.  Pneumoperitoneum was smoothly established up to a patient pressure of 15  mmHg. Laparoscope was advanced. There was no evidence of a  contralateral hernia. The patient had a defect lateral to  the inferior epigastric vessel, consistent with an left indirect  hernia. Two 5-mm trocars were placed, one on the right, one on the left  in the midclavicular line slightly above the level of the umbilicus all  under direct visualization. After local had been infiltrated, I then  made incision along the peritoneum on the left, starting 2 inches above  the anterior superior iliac spine and caring it medial  toward the median umbilical ligament in a lazy S configuration using  Endo Shears with electrocautery. The peritoneal flap was then gently  dissected downward from the anterior abdominal wall taking care not to  injure the inferior epigastric vessels. The pubic bone was identified.  The testicular vessels were identified.  Using  traction and counter traction with short graspers, I reduced the sac in  its entirety. The testicular vessels had been identified and preserved. The vas deferens was identified and preserved, and the hernia sac was stripped from those to  surrounding structures. I then went about creating a large pocket by  lifting the peritoneum of the pelvic floor. I took great care not to  injure the iliac vessels. Exparel anesthetic was injected 2 finger breadths below and medial to the anterior superior iliac spine as well as along the left groin prior to placing the mesh. I then obtained a piece of Ethicon UltraPro mesh  3 inch x  6 inch, placed it through the Hasson trocar, half of it covered medial  to the inferior epigastric vessels and half of it lateral to the  inferior epigastric vessels. The defect was well  covered with the mesh. I then secured the mesh to the abdominal wall  using an  Ethicon secure strap tack. Tacks were placed on each side of the inferior epigastric  vessel and two tacks out laterally and one at superior medial edge of mesh in midline. No tacks were placed below the  shelving edge of the inguinal ligament. Pneumoperitoneum was reduced  to 8 mmHg. I then brought the peritoneal flap back up to the abdominal  wall and tacked it to the abdominal wall using 4 tacks. There was no  defect in the peritoneum, and the mesh was well covered. I removed the  Hasson trocar and tied down the previously placed pursestring suture.  The closure was viewed laparoscopically. There was no evidence of  fascial defect. I did place 2 additional interrupted 0 vicryl sutures at the umbilical fascia. There was no air leak at the umbilicus. There was no  evidence of injury to surrounding structures. Pneumoperitoneum was  released, and the remaining trocars were removed. All skin incisions  were closed with a 4-0 Monocryl in a subcuticular fashion followed by  application of benzoin, steri strips, bandages. All needle, instrument, and sponge counts  were correct x2. There are no immediate complications. The patient  tolerated the procedure well. The patient was extubated and taken to the  recovery room in stable condition.  Leighton Ruff. Redmond Pulling, MD, FACS General, Bariatric, & Minimally Invasive Surgery [facility], [place]

## 2015-05-05 NOTE — H&P (Signed)
Gilad W. Rodas 03/10/2015 3:00 PM Location: [facility] Patient #: [id number] DOB: 12/17/1955 Married / Language: English / Race: White Male  History of Present Illness (Lois Ostrom M. Arizbeth Cawthorn MD; 03/11/2015 6:20 PM) The patient is a 59 year old male who presents with an inguinal hernia. He is referred by Dr. Fry for evaluation of a left inguinal hernia. He states that he initially had some left groin discomfort last fall. He noticed a small bulge at that time. However unfortunately he was found to have tongue cancer. He has gone through 30 rounds of radiation and 6 rounds of chemotherapy for his tongue cancer. He finished his chemotherapy in early April. This is being managed at [facility]. He has a PET scan this coming Monday. His inguinal hernia is becoming more noticeable. It does cause some discomfort at time. It is now starting to interfere with his activity. He denies any dysuria. He does have some nocturia. He denies any chest pain, chest pressure, shortness of breath, dyspnea on exertion, TIAs or amaurosis fugax. He denies any prior abdominal surgery.   Problem List/Past Medical (Lavonte Palos M Whitt Auletta, MD; 03/11/2015 6:22 PM) LEFT INGUINAL HERNIA (550.90  K40.90)  Other Problems (Jakhai Fant M Kirubel Aja, MD; 03/11/2015 6:22 PM) Cancer High blood pressure Inguinal Hernia  Past Surgical History (Sonya Bynum, CMA; 03/10/2015 2:44 PM) Knee Surgery Bilateral. Vasectomy  Diagnostic Studies History (Sonya Bynum, CMA; 03/10/2015 2:44 PM) Colonoscopy 5-10 years ago  Allergies (Sonya Bynum, CMA; 03/10/2015 2:45 PM) Valsartan *ANTIHYPERTENSIVES*  Medication History (Hana Trippett M Jakyra Kenealy, MD; 03/11/2015 6:22 PM) Aspirin Childrens (81MG Tablet Chewable, Oral) Active. Medications Reconciled Simvastatin (20MG Tablet, Oral) Active. Flomax (0.4MG Capsule, 1 (one) Capsule Oral daily, Taken starting 03/10/2015) Active. (start taking 2 weeks before surgery)  Social History (Sonya Bynum, CMA;  03/10/2015 2:44 PM) Alcohol use Occasional alcohol use. Caffeine use Carbonated beverages, Coffee, Tea. No drug use Tobacco use Former smoker.  Family History (Sonya Bynum, CMA; 03/10/2015 2:44 PM) Heart Disease Father. Hypertension Father. Melanoma Brother, Father. Respiratory Condition Mother.  Review of Systems (Sonya Bynum CMA; 03/10/2015 2:44 PM) General Not Present- Appetite Loss, Chills, Fatigue, Fever, Night Sweats, Weight Gain and Weight Loss. Skin Not Present- Change in Wart/Mole, Dryness, Hives, Jaundice, New Lesions, Non-Healing Wounds, Rash and Ulcer. HEENT Present- Ringing in the Ears. Not Present- Earache, Hearing Loss, Hoarseness, Nose Bleed, Oral Ulcers, Seasonal Allergies, Sinus Pain, Sore Throat, Visual Disturbances, Wears glasses/contact lenses and Yellow Eyes. Respiratory Present- Snoring. Not Present- Bloody sputum, Chronic Cough, Difficulty Breathing and Wheezing. Breast Not Present- Breast Mass, Breast Pain, Nipple Discharge and Skin Changes. Cardiovascular Not Present- Chest Pain, Difficulty Breathing Lying Down, Leg Cramps, Palpitations, Rapid Heart Rate, Shortness of Breath and Swelling of Extremities. Gastrointestinal Present- Abdominal Pain. Not Present- Bloating, Bloody Stool, Change in Bowel Habits, Chronic diarrhea, Constipation, Difficulty Swallowing, Excessive gas, Gets full quickly at meals, Hemorrhoids, Indigestion, Nausea, Rectal Pain and Vomiting. Male Genitourinary Not Present- Blood in Urine, Change in Urinary Stream, Frequency, Impotence, Nocturia, Painful Urination, Urgency and Urine Leakage. Musculoskeletal Not Present- Back Pain, Joint Pain, Joint Stiffness, Muscle Pain, Muscle Weakness and Swelling of Extremities. Neurological Not Present- Decreased Memory, Fainting, Headaches, Numbness, Seizures, Tingling, Tremor, Trouble walking and Weakness. Psychiatric Not Present- Anxiety, Bipolar, Change in Sleep Pattern, Depression, Fearful and Frequent  crying. Endocrine Not Present- Cold Intolerance, Excessive Hunger, Hair Changes, Heat Intolerance, Hot flashes and New Diabetes. Hematology Not Present- Easy Bruising, Excessive bleeding, Gland problems, HIV and Persistent Infections.   Vitals (Sonya Bynum CMA; 03/10/2015 2:45   PM) 03/10/2015 2:44 PM Weight: 190 lb Height: 73in Body Surface Area: 2.11 m Body Mass Index: 25.07 kg/m Temp.: 98F(Temporal)  Pulse: 81 (Regular)  BP: 140/82 (Sitting, Left Arm, Standard)    Physical Exam (Korra Christine M. Nelvin Tomb MD; 03/11/2015 6:17 PM) General Mental Status-Alert. General Appearance-Consistent with stated age. Hydration-Well hydrated. Voice-Normal.  Head and Neck Head-normocephalic, atraumatic with no lesions or palpable masses. Trachea-midline. Thyroid Gland Characteristics - normal size and consistency.  Eye Eyeball - Bilateral-Extraocular movements intact. Sclera/Conjunctiva - Bilateral-No scleral icterus.  Chest and Lung Exam Chest and lung exam reveals -quiet, even and easy respiratory effort with no use of accessory muscles and on auscultation, normal breath sounds, no adventitious sounds and normal vocal resonance. Inspection Chest Wall - Normal. Back - normal.  Breast - Did not examine.  Cardiovascular Cardiovascular examination reveals -normal heart sounds, regular rate and rhythm with no murmurs and normal pedal pulses bilaterally.  Abdomen Inspection Inspection of the abdomen reveals - No Hernias. Skin - Scar - no surgical scars. Palpation/Percussion Palpation and Percussion of the abdomen reveal - Soft, Non Tender, No Rebound tenderness, No Rigidity (guarding) and No hepatosplenomegaly. Auscultation Auscultation of the abdomen reveals - Bowel sounds normal.  Male Genitourinary Note: Patient examined supine and standing with and without Valsalva. No evidence of right inguinal hernia. Both testicles down. No scrotal masses. Reducible left  inguinal hernia it is slightly lateral. It is more consistent with an indirect hernia. Nontender   Peripheral Vascular Upper Extremity Palpation - Pulses bilaterally normal.  Neurologic Neurologic evaluation reveals -alert and oriented x 3 with no impairment of recent or remote memory. Mental Status-Normal.  Neuropsychiatric The patient's mood and affect are described as -normal. Judgment and Insight-insight is appropriate concerning matters relevant to self.  Musculoskeletal Normal Exam - Left-Upper Extremity Strength Normal and Lower Extremity Strength Normal. Normal Exam - Right-Upper Extremity Strength Normal and Lower Extremity Strength Normal.  Lymphatic Head & Neck  General Head & Neck Lymphatics: Bilateral - Description - Normal. Axillary - Did not examine. Femoral & Inguinal - Did not examine.    Assessment & Plan (Deedra Pro M. Kasai Beltran MD; 03/11/2015 6:21 PM) LEFT INGUINAL HERNIA (550.90  K40.90) Impression: We discussed the etiology of inguinal hernias. We discussed the signs & symptoms of incarceration & strangulation. We discussed non-operative and operative management.  The patient has elected to proceed with laparoscopic repair of left inguinal hernia with mesh (assuming his PET scan is negative)  I described the procedure in detail. The patient was given educational material. We discussed the risks and benefits including but not limited to bleeding, infection, chronic inguinal pain, nerve entrapment, hernia recurrence, mesh complications, hematoma formation, urinary retention, injury to the testicles, numbness in the groin, blood clots, injury to the surrounding structures, and anesthesia risk. We also discussed the typical post operative recovery course, including no heavy lifting for 4-6 weeks. I explained that the likelihood of improvement of their symptoms is good  Our office will contact him next week to schedule surgery at that time he will let us know  the results of his PET scan.  Because of some of his BPH symptoms we will place him on perioperative Flomax to decrease his risk of postoperative urinary retention Current Plans  Schedule for Surgery Pt Education - Pamphlet Given - Laparoscopic Hernia Repair: discussed with patient and provided information. Started Flomax 0.4MG, 1 (one) Capsule daily, #21, 21 days starting 03/10/2015, No Refill. Local Order: start taking 2 weeks before surgery  Norissa Bartee M.   Redmond Pulling, MD, FACS General, Bariatric, & Minimally Invasive Surgery [facility], [place]

## 2015-05-05 NOTE — Discharge Instructions (Signed)
[facility]  UMBILICAL OR INGUINAL HERNIA REPAIR: POST OP INSTRUCTIONS  Always review your discharge instruction sheet given to you by the facility where your surgery was performed. IF YOU HAVE DISABILITY OR FAMILY LEAVE FORMS, YOU MUST BRING THEM TO THE OFFICE FOR PROCESSING.   DO NOT GIVE THEM TO YOUR DOCTOR.  1. A  prescription for pain medication may be given to you upon discharge.  Take your pain medication as prescribed, if needed.  If narcotic pain medicine is not needed, then you may take acetaminophen (Tylenol) or ibuprofen (Advil) as needed. 2. Take your usually prescribed medications unless otherwise directed. 3. If you need a refill on your pain medication, please contact your pharmacy.  They will contact our office to request authorization. Prescriptions will not be filled after 5 pm or on week-ends. 4. You should follow a light diet the first 24 hours after arrival home, such as soup and crackers, etc.  Be sure to include lots of fluids daily.  Resume your normal diet the day after surgery. 5. Most patients will experience some swelling and bruising around the umbilicus or in the groin and scrotum.  Ice packs and reclining will help.  Swelling and bruising can take several days to resolve.  6. It is common to experience some constipation if taking pain medication after surgery.  Increasing fluid intake and taking a stool softener (such as Colace) will usually help or prevent this problem from occurring.  A mild laxative (Milk of Magnesia or Miralax) should be taken according to package directions if there are no bowel movements after 48 hours. 7. Unless discharge instructions indicate otherwise, you may remove your bandages 48 hours after surgery, and you may shower at that time.  You  have steri-strips (small skin tapes) in place directly over the incision.  These strips should be left on the skin for 7-10 days.   8. ACTIVITIES:  You may resume regular (light) daily  activities beginning the next day--such as daily self-care, walking, climbing stairs--gradually increasing activities as tolerated.  You may have sexual intercourse when it is comfortable.  Refrain from any heavy lifting or straining until approved by your doctor. a. You may drive when you are no longer taking prescription pain medication, you can comfortably wear a seatbelt, and you can safely maneuver your car and apply brakes. b. RETURN TO WORK:  9. You should see your doctor in the office for a follow-up appointment approximately 2-3 weeks after your surgery.  Make sure that you call for this appointment within a day or two after you arrive home to insure a convenient appointment time. 10. OTHER INSTRUCTIONS: DO NOT LIFT, PUSH, OR PULL ANYTHING GREATER THAN 10 POUNDS FOR 6 WEEKS    WHEN TO CALL YOUR DOCTOR: 1. Fever over 101.0 2. Inability to urinate 3. Nausea and/or vomiting 4. Extreme swelling or bruising 5. Continued bleeding from incision. 6. Increased pain, redness, or drainage from the incision  The clinic staff is available to answer your questions during regular business hours.  Please dont hesitate to call and ask to speak to one of the nurses for clinical concerns.  If you have a medical emergency, go to the nearest emergency room or call 911.  A surgeon from [facility] is always on call at the hospital   [address] [place], [place], [place]  [address] ?  [address], [place], [place]   [address] [phone] ? [phone] ? FAX [phone] Web site: [email or web address]   Post Anesthesia Home Care Instructions  Activity: Get plenty of rest for the remainder of the day. A responsible adult should stay with you for 24 hours following the procedure.  For the next 24 hours, DO NOT: -Drive a car -Paediatric nurse -Drink alcoholic beverages -Take any medication unless instructed by your physician -Make any legal decisions or sign  important papers.  Meals: Start with liquid foods such as gelatin or soup. Progress to regular foods as tolerated. Avoid greasy, spicy, heavy foods. If nausea and/or vomiting occur, drink only clear liquids until the nausea and/or vomiting subsides. Call your physician if vomiting continues.  Special Instructions/Symptoms: Your throat may feel dry or sore from the anesthesia or the breathing tube placed in your throat during surgery. If this causes discomfort, gargle with warm salt water. The discomfort should disappear within 24 hours.  If you had a scopolamine patch placed behind your ear for the management of post- operative nausea and/or vomiting:  1. The medication in the patch is effective for 72 hours, after which it should be removed.  Wrap patch in a tissue and discard in the trash. Wash hands thoroughly with soap and water. 2. You may remove the patch earlier than 72 hours if you experience unpleasant side effects which may include dry mouth, dizziness or visual disturbances. 3. Avoid touching the patch. Wash your hands with soap and water after contact with the patch.

## 2015-05-06 ENCOUNTER — Encounter (HOSPITAL_BASED_OUTPATIENT_CLINIC_OR_DEPARTMENT_OTHER): Payer: Self-pay | Admitting: General Surgery

## 2015-11-01 ENCOUNTER — Telehealth: Payer: Self-pay | Admitting: Family Medicine

## 2015-11-01 MED ORDER — OSELTAMIVIR PHOSPHATE 75 MG PO CAPS: 75.0000 mg | ORAL_CAPSULE | Freq: Two times a day (BID) | ORAL | Status: DC

## 2015-11-01 NOTE — Telephone Encounter (Signed)
Pt wife call to say that she went to Urgent care for flu like symptons. Her test was negative but they gave her Tamiflu. Wife said husband has been expose to her and he is a cancer survior and is is asking if a rx Tamiflu can be called in for him.    [facility]

## 2015-11-01 NOTE — Telephone Encounter (Signed)
I sent script e-scribe and spoke with wife.

## 2015-11-01 NOTE — Telephone Encounter (Signed)
Per Dr. Sarajane Jews order Tamiflu 75 mg bid for 5 days.

## 2016-03-08 ENCOUNTER — Encounter: Payer: Self-pay | Admitting: Family Medicine

## 2016-03-08 ENCOUNTER — Ambulatory Visit (INDEPENDENT_AMBULATORY_CARE_PROVIDER_SITE_OTHER): Payer: 59 | Admitting: Family Medicine

## 2016-03-08 VITALS — BP 109/68 | HR 82 | Temp 98.1°F | Ht 73.0 in | Wt 193.0 lb

## 2016-03-08 DIAGNOSIS — N401 Enlarged prostate with lower urinary tract symptoms: Secondary | ICD-10-CM | POA: Diagnosis not present

## 2016-03-08 DIAGNOSIS — C01 Malignant neoplasm of base of tongue: Secondary | ICD-10-CM | POA: Insufficient documentation

## 2016-03-08 DIAGNOSIS — I10 Essential (primary) hypertension: Secondary | ICD-10-CM

## 2016-03-08 DIAGNOSIS — E785 Hyperlipidemia, unspecified: Secondary | ICD-10-CM | POA: Diagnosis not present

## 2016-03-08 DIAGNOSIS — N138 Other obstructive and reflux uropathy: Secondary | ICD-10-CM

## 2016-03-08 LAB — POC URINALSYSI DIPSTICK (AUTOMATED)
Bilirubin, UA: NEGATIVE
Blood, UA: NEGATIVE
Glucose, UA: NEGATIVE
Leukocytes, UA: NEGATIVE
Nitrite, UA: NEGATIVE
Spec Grav, UA: 1.015
Urobilinogen, UA: 0.2
pH, UA: 7.5

## 2016-03-08 LAB — BASIC METABOLIC PANEL
BUN: 21 mg/dL (ref 6–23)
CO2: 27 mEq/L (ref 19–32)
Calcium: 9.7 mg/dL (ref 8.4–10.5)
Chloride: 102 mEq/L (ref 96–112)
Creatinine, Ser: 1.07 mg/dL (ref 0.40–1.50)
GFR: 75.27 mL/min (ref >60.00)
Glucose, Bld: 77 mg/dL (ref 70–99)
Potassium: 4.4 mEq/L (ref 3.5–5.1)
Sodium: 139 mEq/L (ref 135–145)

## 2016-03-08 LAB — CBC WITH DIFFERENTIAL/PLATELET
Basophils Absolute: 0 10*3/uL (ref 0.0–0.1)
Basophils Relative: 0.3 % (ref 0.0–3.0)
Eosinophils Absolute: 0.1 10*3/uL (ref 0.0–0.7)
Eosinophils Relative: 1.1 % (ref 0.0–5.0)
HCT: 36.8 % — ABNORMAL LOW (ref 39.0–52.0)
Hemoglobin: 12.1 g/dL — ABNORMAL LOW (ref 13.0–17.0)
Lymphocytes Relative: 6.6 % — ABNORMAL LOW (ref 12.0–46.0)
Lymphs Abs: 0.4 10*3/uL — ABNORMAL LOW (ref 0.7–4.0)
MCHC: 32.7 g/dL (ref 30.0–36.0)
MCV: 78.6 fl (ref 78.0–100.0)
Monocytes Absolute: 0.6 10*3/uL (ref 0.1–1.0)
Monocytes Relative: 9.2 % (ref 3.0–12.0)
Neutro Abs: 5.6 10*3/uL (ref 1.4–7.7)
Neutrophils Relative %: 82.8 % — ABNORMAL HIGH (ref 43.0–77.0)
Platelets: 233 10*3/uL (ref 150.0–400.0)
RBC: 4.69 Mil/uL (ref 4.22–5.81)
RDW: 18.2 % — ABNORMAL HIGH (ref 11.5–15.5)
WBC: 6.8 10*3/uL (ref 4.0–10.5)

## 2016-03-08 LAB — HEPATIC FUNCTION PANEL
ALT: 18 U/L (ref 0–53)
AST: 33 U/L (ref 0–37)
Albumin: 4.5 g/dL (ref 3.5–5.2)
Alkaline Phosphatase: 40 U/L (ref 39–117)
Bilirubin, Direct: 0.2 mg/dL (ref 0.0–0.3)
Total Bilirubin: 0.6 mg/dL (ref 0.2–1.2)
Total Protein: 7 g/dL (ref 6.0–8.3)

## 2016-03-08 LAB — LIPID PANEL
Cholesterol: 141 mg/dL (ref 0–200)
HDL: 67.5 mg/dL (ref >39.00)
LDL Cholesterol: 61 mg/dL (ref 0–99)
NonHDL: 73.33
Total CHOL/HDL Ratio: 2
Triglycerides: 64 mg/dL (ref 0.0–149.0)
VLDL: 12.8 mg/dL (ref 0.0–40.0)

## 2016-03-08 LAB — PSA: PSA: 0.76 ng/mL (ref 0.10–4.00)

## 2016-03-08 LAB — TSH: TSH: 3.79 u[IU]/mL (ref 0.35–4.50)

## 2016-03-08 MED ORDER — SIMVASTATIN 20 MG PO TABS: 20.0000 mg | ORAL_TABLET | Freq: Every day | ORAL | Status: DC

## 2016-03-08 NOTE — Progress Notes (Signed)
   Subjective:    Patient ID: Terry Holt, male    DOB: 24-Feb-1956, 60 y.o.   MRN: [id number]  HPI Here to follow up on lipids and HTN. He is doing well after treatment for cancer of the base of the tongue. He sees the [facility] and ENT clinics every 2 months, and so far there has been no evidence of cancer recurrence. He has a chronic dry mouth from the radiation but otherwise he feels well. BP is stable.    Review of Systems  Constitutional: Negative.   Respiratory: Negative.   Cardiovascular: Negative.   Neurological: Negative.        Objective:   Physical Exam  Constitutional: He is oriented to person, place, and time. He appears well-developed and well-nourished.  Neck: Neck supple. No thyromegaly present.  Cardiovascular: Normal rate, regular rhythm, normal heart sounds and intact distal pulses.   Pulmonary/Chest: Effort normal and breath sounds normal.  Lymphadenopathy:    He has no cervical adenopathy.  Neurological: He is alert and oriented to person, place, and time.          Assessment & Plan:  His HTN is stable. Check lipids with other fasting labs today.  [name], MD

## 2016-03-08 NOTE — Progress Notes (Signed)
Pre visit review using our clinic review tool, if applicable. No additional management support is needed unless otherwise documented below in the visit note. 

## 2016-10-26 DIAGNOSIS — Z923 Personal history of irradiation: Secondary | ICD-10-CM | POA: Diagnosis not present

## 2016-10-26 DIAGNOSIS — Z9221 Personal history of antineoplastic chemotherapy: Secondary | ICD-10-CM | POA: Diagnosis not present

## 2016-10-26 DIAGNOSIS — C01 Malignant neoplasm of base of tongue: Secondary | ICD-10-CM | POA: Diagnosis not present

## 2016-11-29 DIAGNOSIS — M47892 Other spondylosis, cervical region: Secondary | ICD-10-CM | POA: Diagnosis not present

## 2016-11-29 DIAGNOSIS — M9901 Segmental and somatic dysfunction of cervical region: Secondary | ICD-10-CM | POA: Diagnosis not present

## 2016-11-29 DIAGNOSIS — M9902 Segmental and somatic dysfunction of thoracic region: Secondary | ICD-10-CM | POA: Diagnosis not present

## 2016-12-25 DIAGNOSIS — C01 Malignant neoplasm of base of tongue: Secondary | ICD-10-CM | POA: Diagnosis not present

## 2016-12-25 DIAGNOSIS — Z510 Encounter for antineoplastic radiation therapy: Secondary | ICD-10-CM | POA: Diagnosis not present

## 2016-12-25 DIAGNOSIS — D496 Neoplasm of unspecified behavior of brain: Secondary | ICD-10-CM | POA: Diagnosis not present

## 2016-12-25 DIAGNOSIS — C109 Malignant neoplasm of oropharynx, unspecified: Secondary | ICD-10-CM | POA: Diagnosis not present

## 2016-12-25 DIAGNOSIS — C029 Malignant neoplasm of tongue, unspecified: Secondary | ICD-10-CM | POA: Diagnosis not present

## 2016-12-25 LAB — TSH: TSH: 3.67 u[IU]/mL (ref <=5.90)

## 2016-12-28 ENCOUNTER — Encounter: Payer: Self-pay | Admitting: Family Medicine

## 2017-04-11 DIAGNOSIS — K148 Other diseases of tongue: Secondary | ICD-10-CM | POA: Diagnosis not present

## 2017-04-11 DIAGNOSIS — R220 Localized swelling, mass and lump, head: Secondary | ICD-10-CM | POA: Diagnosis not present

## 2017-04-11 DIAGNOSIS — B3789 Other sites of candidiasis: Secondary | ICD-10-CM | POA: Diagnosis not present

## 2017-04-11 DIAGNOSIS — Z85810 Personal history of malignant neoplasm of tongue: Secondary | ICD-10-CM | POA: Diagnosis not present

## 2017-04-24 ENCOUNTER — Telehealth: Payer: Self-pay | Admitting: Internal Medicine

## 2017-04-24 NOTE — Telephone Encounter (Signed)
Left message for patient to call back  

## 2017-04-24 NOTE — Telephone Encounter (Signed)
Throat cancer does not change a colonoscopy recall

## 2017-04-24 NOTE — Telephone Encounter (Signed)
Would this effect his colonoscopy recall at all?

## 2017-04-25 NOTE — Telephone Encounter (Signed)
Left message for patient to call back  

## 2017-04-25 NOTE — Telephone Encounter (Signed)
Patient notified of recommendations. He will call back for any additional questions or concerns.

## 2017-05-03 DIAGNOSIS — D225 Melanocytic nevi of trunk: Secondary | ICD-10-CM | POA: Diagnosis not present

## 2017-05-03 DIAGNOSIS — Z1283 Encounter for screening for malignant neoplasm of skin: Secondary | ICD-10-CM | POA: Diagnosis not present

## 2017-05-09 ENCOUNTER — Encounter: Payer: Self-pay | Admitting: Family Medicine

## 2017-05-10 DIAGNOSIS — M1712 Unilateral primary osteoarthritis, left knee: Secondary | ICD-10-CM | POA: Diagnosis not present

## 2017-05-23 DIAGNOSIS — R220 Localized swelling, mass and lump, head: Secondary | ICD-10-CM | POA: Diagnosis not present

## 2017-06-13 ENCOUNTER — Ambulatory Visit (INDEPENDENT_AMBULATORY_CARE_PROVIDER_SITE_OTHER): Payer: 59 | Admitting: Family Medicine

## 2017-06-13 ENCOUNTER — Encounter: Payer: Self-pay | Admitting: Family Medicine

## 2017-06-13 VITALS — BP 130/73 | HR 69 | Temp 97.6°F | Ht 73.0 in | Wt 195.0 lb

## 2017-06-13 DIAGNOSIS — Z0000 Encounter for general adult medical examination without abnormal findings: Secondary | ICD-10-CM | POA: Diagnosis not present

## 2017-06-13 LAB — HEPATIC FUNCTION PANEL
ALT: 15 U/L (ref 0–53)
AST: 23 U/L (ref 0–37)
Albumin: 4.5 g/dL (ref 3.5–5.2)
Alkaline Phosphatase: 45 U/L (ref 39–117)
Bilirubin, Direct: 0.2 mg/dL (ref 0.0–0.3)
Total Bilirubin: 0.8 mg/dL (ref 0.2–1.2)
Total Protein: 6.9 g/dL (ref 6.0–8.3)

## 2017-06-13 LAB — CBC WITH DIFFERENTIAL/PLATELET
Basophils Absolute: 0 10*3/uL (ref 0.0–0.1)
Basophils Relative: 0.5 % (ref 0.0–3.0)
Eosinophils Absolute: 0 10*3/uL (ref 0.0–0.7)
Eosinophils Relative: 0.5 % (ref 0.0–5.0)
HCT: 44.4 % (ref 39.0–52.0)
Hemoglobin: 15.3 g/dL (ref 13.0–17.0)
Lymphocytes Relative: 9 % — ABNORMAL LOW (ref 12.0–46.0)
Lymphs Abs: 0.7 10*3/uL (ref 0.7–4.0)
MCHC: 34.4 g/dL (ref 30.0–36.0)
MCV: 98.2 fl (ref 78.0–100.0)
Monocytes Absolute: 0.6 10*3/uL (ref 0.1–1.0)
Monocytes Relative: 7.9 % (ref 3.0–12.0)
Neutro Abs: 6.1 10*3/uL (ref 1.4–7.7)
Neutrophils Relative %: 82.1 % — ABNORMAL HIGH (ref 43.0–77.0)
Platelets: 212 10*3/uL (ref 150.0–400.0)
RBC: 4.52 Mil/uL (ref 4.22–5.81)
RDW: 14 % (ref 11.5–15.5)
WBC: 7.5 10*3/uL (ref 4.0–10.5)

## 2017-06-13 LAB — POC URINALSYSI DIPSTICK (AUTOMATED)
Bilirubin, UA: NEGATIVE
Blood, UA: NEGATIVE
Glucose, UA: NEGATIVE
Ketones, UA: NEGATIVE
Leukocytes, UA: NEGATIVE
Nitrite, UA: NEGATIVE
Protein, UA: NEGATIVE
Spec Grav, UA: 1.01 (ref 1.010–1.025)
Urobilinogen, UA: 0.2 E.U./dL
pH, UA: 6.5 (ref 5.0–8.0)

## 2017-06-13 LAB — BASIC METABOLIC PANEL
BUN: 19 mg/dL (ref 6–23)
CO2: 31 mEq/L (ref 19–32)
Calcium: 9.9 mg/dL (ref 8.4–10.5)
Chloride: 99 mEq/L (ref 96–112)
Creatinine, Ser: 0.98 mg/dL (ref 0.40–1.50)
GFR: 82.94 mL/min (ref >60.00)
Glucose, Bld: 87 mg/dL (ref 70–99)
Potassium: 4.2 mEq/L (ref 3.5–5.1)
Sodium: 138 mEq/L (ref 135–145)

## 2017-06-13 LAB — LIPID PANEL
Cholesterol: 203 mg/dL — ABNORMAL HIGH (ref 0–200)
HDL: 73.4 mg/dL (ref >39.00)
LDL Cholesterol: 113 mg/dL — ABNORMAL HIGH (ref 0–99)
NonHDL: 129.18
Total CHOL/HDL Ratio: 3
Triglycerides: 79 mg/dL (ref 0.0–149.0)
VLDL: 15.8 mg/dL (ref 0.0–40.0)

## 2017-06-13 LAB — TSH: TSH: 4.42 u[IU]/mL (ref 0.35–4.50)

## 2017-06-13 LAB — PSA: PSA: 0.96 ng/mL (ref 0.10–4.00)

## 2017-06-13 NOTE — Progress Notes (Signed)
   Subjective:    Patient ID: Terry Holt, male    DOB: 1955-12-22, 61 y.o.   MRN: [id number]  HPI Here for a well exam. He feels well in general. 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 This was quite successful and he has recovered. He still has a very dry mouth, but his sense of taste has returned to normal. He recently saw his ENT at [facility], Dr. 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 This was felt to be benign, and it will be rechecked in December.    Review of Systems  Constitutional: Negative.   HENT: Negative.   Eyes: Negative.   Respiratory: Negative.   Cardiovascular: Negative.   Gastrointestinal: Negative.   Genitourinary: Negative.   Musculoskeletal: Negative.   Skin: Negative.   Neurological: Negative.   Psychiatric/Behavioral: Negative.        Objective:   Physical Exam  Constitutional: He is oriented to person, place, and time. He appears well-developed and well-nourished. No distress.  HENT:  Head: Normocephalic and atraumatic.  Right Ear: External ear normal.  Left Ear: External ear normal.  Nose: Nose normal.  Mouth/Throat: Oropharynx is clear and moist. No oropharyngeal exudate.  Eyes: Pupils are equal, round, and reactive to light. Conjunctivae and EOM are normal. Right eye exhibits no discharge. Left eye exhibits no discharge. No scleral icterus.  Neck: Neck supple. No JVD present. No tracheal deviation present. No thyromegaly present.  Cardiovascular: Normal rate, regular rhythm, normal heart sounds and intact distal pulses.  Exam reveals no gallop and no friction rub.   No murmur heard. Pulmonary/Chest: Effort normal and breath sounds normal. No respiratory distress. He has no wheezes. He has no rales. He exhibits no tenderness.  Abdominal: Soft. Bowel sounds are normal. He exhibits no distension and no mass. There is no tenderness. There is no  rebound and no guarding.  Genitourinary: Rectum normal, prostate normal and penis normal. Rectal exam shows guaiac negative stool. No penile tenderness.  Musculoskeletal: Normal range of motion. He exhibits no edema or tenderness.  Lymphadenopathy:    He has no cervical adenopathy.  Neurological: He is alert and oriented to person, place, and time. He has normal reflexes. No cranial nerve deficit. He exhibits normal muscle tone. Coordination normal.  Skin: Skin is warm and dry. No rash noted. He is not diaphoretic. No erythema. No pallor.  Psychiatric: He has a normal mood and affect. His behavior is normal. Judgment and thought content normal.          Assessment & Plan:  Well exam. We discussed diet and exercise. Get fasting labs.  Alysia Penna, MD

## 2017-06-13 NOTE — Patient Instructions (Signed)
WE NOW OFFER   [facility] FAST TRACK!!!  SAME DAY Appointments for ACUTE CARE  Such as: Sprains, Injuries, cuts, abrasions, rashes, muscle pain, joint pain, back pain Colds, flu, sore throats, headache, allergies, cough, fever  Ear pain, sinus and eye infections Abdominal pain, nausea, vomiting, diarrhea, upset stomach Animal/insect bites  3 Easy Ways to Schedule: Walk-In Scheduling Call in scheduling Mychart Sign-up: [email or web address]         

## 2017-06-14 DIAGNOSIS — M1712 Unilateral primary osteoarthritis, left knee: Secondary | ICD-10-CM | POA: Diagnosis not present

## 2017-06-21 DIAGNOSIS — M1712 Unilateral primary osteoarthritis, left knee: Secondary | ICD-10-CM | POA: Diagnosis not present

## 2017-06-26 DIAGNOSIS — M1712 Unilateral primary osteoarthritis, left knee: Secondary | ICD-10-CM | POA: Diagnosis not present

## 2017-08-07 DIAGNOSIS — M47892 Other spondylosis, cervical region: Secondary | ICD-10-CM | POA: Diagnosis not present

## 2017-08-07 DIAGNOSIS — M9901 Segmental and somatic dysfunction of cervical region: Secondary | ICD-10-CM | POA: Diagnosis not present

## 2017-08-07 DIAGNOSIS — M9902 Segmental and somatic dysfunction of thoracic region: Secondary | ICD-10-CM | POA: Diagnosis not present

## 2017-08-08 DIAGNOSIS — R220 Localized swelling, mass and lump, head: Secondary | ICD-10-CM | POA: Diagnosis not present

## 2017-11-19 DIAGNOSIS — C029 Malignant neoplasm of tongue, unspecified: Secondary | ICD-10-CM | POA: Diagnosis not present

## 2017-11-19 DIAGNOSIS — C01 Malignant neoplasm of base of tongue: Secondary | ICD-10-CM | POA: Diagnosis not present

## 2017-11-19 DIAGNOSIS — D496 Neoplasm of unspecified behavior of brain: Secondary | ICD-10-CM | POA: Diagnosis not present

## 2017-11-19 DIAGNOSIS — Z510 Encounter for antineoplastic radiation therapy: Secondary | ICD-10-CM | POA: Diagnosis not present

## 2017-11-19 DIAGNOSIS — C109 Malignant neoplasm of oropharynx, unspecified: Secondary | ICD-10-CM | POA: Diagnosis not present

## 2017-12-11 ENCOUNTER — Encounter: Payer: Self-pay | Admitting: Family Medicine

## 2017-12-11 DIAGNOSIS — R7989 Other specified abnormal findings of blood chemistry: Secondary | ICD-10-CM

## 2017-12-11 NOTE — Telephone Encounter (Signed)
Okay to order labs? Please advise diagnosis if so. Just TSH or free T3, T4 as well? Thanks!

## 2017-12-16 NOTE — Telephone Encounter (Signed)
Please order TSH, free T3, and free T4 and use R79.89

## 2017-12-18 NOTE — Telephone Encounter (Signed)
I put in the lab orders so he can just make a lab appt

## 2017-12-19 ENCOUNTER — Other Ambulatory Visit (INDEPENDENT_AMBULATORY_CARE_PROVIDER_SITE_OTHER): Payer: 59

## 2017-12-19 DIAGNOSIS — R7989 Other specified abnormal findings of blood chemistry: Secondary | ICD-10-CM | POA: Diagnosis not present

## 2017-12-19 DIAGNOSIS — M47892 Other spondylosis, cervical region: Secondary | ICD-10-CM | POA: Diagnosis not present

## 2017-12-19 DIAGNOSIS — M9901 Segmental and somatic dysfunction of cervical region: Secondary | ICD-10-CM | POA: Diagnosis not present

## 2017-12-19 DIAGNOSIS — M9902 Segmental and somatic dysfunction of thoracic region: Secondary | ICD-10-CM | POA: Diagnosis not present

## 2017-12-19 LAB — T3, FREE: T3, Free: 3.5 pg/mL (ref 2.3–4.2)

## 2017-12-19 LAB — T4, FREE: Free T4: 0.83 ng/dL (ref 0.60–1.60)

## 2017-12-19 LAB — TSH: TSH: 5.45 u[IU]/mL — ABNORMAL HIGH (ref 0.35–4.50)

## 2017-12-24 ENCOUNTER — Encounter: Payer: Self-pay | Admitting: Family Medicine

## 2017-12-27 ENCOUNTER — Ambulatory Visit (INDEPENDENT_AMBULATORY_CARE_PROVIDER_SITE_OTHER): Payer: 59 | Admitting: Family Medicine

## 2017-12-27 ENCOUNTER — Encounter: Payer: Self-pay | Admitting: Family Medicine

## 2017-12-27 VITALS — BP 120/76 | HR 77 | Temp 98.4°F | Ht 73.0 in | Wt 197.4 lb

## 2017-12-27 DIAGNOSIS — R7989 Other specified abnormal findings of blood chemistry: Secondary | ICD-10-CM | POA: Diagnosis not present

## 2017-12-27 DIAGNOSIS — N410 Acute prostatitis: Secondary | ICD-10-CM | POA: Diagnosis not present

## 2017-12-27 MED ORDER — DOXYCYCLINE HYCLATE 100 MG PO CAPS: 100.0000 mg | ORAL_CAPSULE | Freq: Two times a day (BID) | ORAL | 0 refills | Status: AC

## 2017-12-27 NOTE — Progress Notes (Signed)
   Subjective:    Patient ID: Terry Holt, male    DOB: 1955-11-04, 62 y.o.   MRN: [id number]  HPI Here for 2 weeks of low back pain and pain in both testicles that comes and goes. He has some mild pain on urination and his stream is slow. No fever. BMs are normal. He asks about his elevated TSH. We did a full panel a few days ago and his free T3 and free T4 were both normal.    Review of Systems  Constitutional: Negative.   Respiratory: Negative.   Gastrointestinal: Negative.   Genitourinary: Positive for difficulty urinating, dysuria and testicular pain. Negative for flank pain, frequency, hematuria and urgency.       Objective:   Physical Exam  Constitutional: He appears well-developed and well-nourished.  Neck: No thyromegaly present.  Cardiovascular: Normal rate, regular rhythm, normal heart sounds and intact distal pulses.  Pulmonary/Chest: Effort normal and breath sounds normal.  Abdominal: Soft. Bowel sounds are normal. He exhibits no distension and no mass. There is no tenderness. There is no rebound and no guarding. No hernia.  Genitourinary:  Genitourinary Comments: The testicles are normal. The prostate is not swollen but is a bit tender   Musculoskeletal: Normal range of motion. He exhibits no tenderness.  Low back is normal   Lymphadenopathy:    He has no cervical adenopathy.          Assessment & Plan:  He has a mild prostatitis which we will treat with Doxycycline. I reassured him his thyroid function is normal. We will continue to monitor these levels closely.  Alysia Penna, MD

## 2017-12-27 NOTE — Telephone Encounter (Signed)
We addressed this at his OV today

## 2018-01-03 DIAGNOSIS — M9902 Segmental and somatic dysfunction of thoracic region: Secondary | ICD-10-CM | POA: Diagnosis not present

## 2018-01-03 DIAGNOSIS — M47892 Other spondylosis, cervical region: Secondary | ICD-10-CM | POA: Diagnosis not present

## 2018-01-03 DIAGNOSIS — M9901 Segmental and somatic dysfunction of cervical region: Secondary | ICD-10-CM | POA: Diagnosis not present

## 2018-01-10 DIAGNOSIS — M9901 Segmental and somatic dysfunction of cervical region: Secondary | ICD-10-CM | POA: Diagnosis not present

## 2018-01-10 DIAGNOSIS — M9902 Segmental and somatic dysfunction of thoracic region: Secondary | ICD-10-CM | POA: Diagnosis not present

## 2018-01-10 DIAGNOSIS — M47892 Other spondylosis, cervical region: Secondary | ICD-10-CM | POA: Diagnosis not present

## 2018-01-24 DIAGNOSIS — M47892 Other spondylosis, cervical region: Secondary | ICD-10-CM | POA: Diagnosis not present

## 2018-01-24 DIAGNOSIS — M9902 Segmental and somatic dysfunction of thoracic region: Secondary | ICD-10-CM | POA: Diagnosis not present

## 2018-01-24 DIAGNOSIS — M9901 Segmental and somatic dysfunction of cervical region: Secondary | ICD-10-CM | POA: Diagnosis not present

## 2018-03-21 DIAGNOSIS — M9901 Segmental and somatic dysfunction of cervical region: Secondary | ICD-10-CM | POA: Diagnosis not present

## 2018-03-21 DIAGNOSIS — M9902 Segmental and somatic dysfunction of thoracic region: Secondary | ICD-10-CM | POA: Diagnosis not present

## 2018-03-21 DIAGNOSIS — M47892 Other spondylosis, cervical region: Secondary | ICD-10-CM | POA: Diagnosis not present

## 2018-04-15 DIAGNOSIS — Z23 Encounter for immunization: Secondary | ICD-10-CM | POA: Diagnosis not present

## 2018-05-16 ENCOUNTER — Encounter: Payer: Self-pay | Admitting: Family Medicine

## 2018-05-16 ENCOUNTER — Ambulatory Visit (INDEPENDENT_AMBULATORY_CARE_PROVIDER_SITE_OTHER): Payer: 59 | Admitting: Family Medicine

## 2018-05-16 VITALS — BP 128/84 | HR 63 | Temp 97.9°F | Wt 200.1 lb

## 2018-05-16 DIAGNOSIS — R300 Dysuria: Secondary | ICD-10-CM

## 2018-05-16 DIAGNOSIS — N410 Acute prostatitis: Secondary | ICD-10-CM

## 2018-05-16 LAB — POC URINALSYSI DIPSTICK (AUTOMATED)
Bilirubin, UA: NEGATIVE
Blood, UA: NEGATIVE
Glucose, UA: NEGATIVE
Ketones, UA: NEGATIVE
Leukocytes, UA: NEGATIVE
Nitrite, UA: NEGATIVE
Protein, UA: NEGATIVE
Spec Grav, UA: 1.015 (ref 1.010–1.025)
Urobilinogen, UA: 0.2 E.U./dL
pH, UA: 7 (ref 5.0–8.0)

## 2018-05-16 NOTE — Progress Notes (Signed)
   Subjective:    Patient ID: Terry Holt, male    DOB: 1956/02/28, 62 y.o.   MRN: [id number]  HPI Here for a possible prostate infection. For about 3 weeks he has had some increased urgency to urinate, but there is no pain or burning. He had a prostatitis in May which also caused some testicle pain and back pain, but he has none of that lately. No fever.    Review of Systems  Constitutional: Negative.   Cardiovascular: Negative.   Gastrointestinal: Negative.   Genitourinary: Positive for frequency and urgency. Negative for difficulty urinating, discharge, dysuria, hematuria and testicular pain.       Objective:   Physical Exam  Constitutional: He appears well-developed and well-nourished.  Cardiovascular: Regular rhythm, normal heart sounds and intact distal pulses.  Pulmonary/Chest: Effort normal and breath sounds normal.  Abdominal: Soft. Bowel sounds are normal. He exhibits no distension and no mass. There is no tenderness. There is no rebound and no guarding. No hernia.  Genitourinary: Rectum normal and prostate normal.          Assessment & Plan:  Likely an early prostatitis. Treat with Doxycycline for 30 days.  Alysia Penna, MD

## 2018-05-20 ENCOUNTER — Encounter: Payer: Self-pay | Admitting: Family Medicine

## 2018-05-20 MED ORDER — DOXYCYCLINE HYCLATE 100 MG PO TABS: 100.0000 mg | ORAL_TABLET | Freq: Two times a day (BID) | ORAL | 0 refills | Status: DC

## 2018-05-20 NOTE — Telephone Encounter (Signed)
Verbal orders received for doxy. Medication filled to pharmacy as requested. Original rx mistakenly sent to [facility] [address]. Canceled rx there and resent to correct pharmacy.

## 2018-06-02 DIAGNOSIS — K219 Gastro-esophageal reflux disease without esophagitis: Secondary | ICD-10-CM | POA: Diagnosis not present

## 2018-06-02 DIAGNOSIS — Z85810 Personal history of malignant neoplasm of tongue: Secondary | ICD-10-CM | POA: Insufficient documentation

## 2018-06-02 DIAGNOSIS — Z923 Personal history of irradiation: Secondary | ICD-10-CM | POA: Diagnosis not present

## 2018-06-02 DIAGNOSIS — D369 Benign neoplasm, unspecified site: Secondary | ICD-10-CM | POA: Insufficient documentation

## 2018-06-13 ENCOUNTER — Other Ambulatory Visit: Payer: Self-pay | Admitting: Family Medicine

## 2018-06-25 DIAGNOSIS — L821 Other seborrheic keratosis: Secondary | ICD-10-CM | POA: Diagnosis not present

## 2018-06-25 DIAGNOSIS — Z1283 Encounter for screening for malignant neoplasm of skin: Secondary | ICD-10-CM | POA: Diagnosis not present

## 2018-06-25 DIAGNOSIS — B078 Other viral warts: Secondary | ICD-10-CM | POA: Diagnosis not present

## 2018-09-11 ENCOUNTER — Encounter: Payer: Self-pay | Admitting: Internal Medicine

## 2018-09-17 ENCOUNTER — Encounter: Payer: Self-pay | Admitting: Internal Medicine

## 2018-10-07 ENCOUNTER — Ambulatory Visit (AMBULATORY_SURGERY_CENTER): Payer: 59 | Admitting: *Deleted

## 2018-10-07 VITALS — Ht 73.0 in | Wt 202.0 lb

## 2018-10-07 DIAGNOSIS — Z1211 Encounter for screening for malignant neoplasm of colon: Secondary | ICD-10-CM

## 2018-10-07 NOTE — Progress Notes (Signed)
Patient denies any allergies to eggs or soy. Patient has post-op nausea and vomiting.  Patient denies any problems with sedation. Patient denies any oxygen use at home. Patient denies taking any diet/weight loss medications or blood thinners. EMMI education offered, pt declined.

## 2018-10-14 ENCOUNTER — Encounter: Payer: Self-pay | Admitting: Internal Medicine

## 2018-10-17 ENCOUNTER — Encounter: Payer: 59 | Admitting: Internal Medicine

## 2018-10-28 ENCOUNTER — Ambulatory Visit (AMBULATORY_SURGERY_CENTER): Payer: 59 | Admitting: Internal Medicine

## 2018-10-28 ENCOUNTER — Encounter: Payer: Self-pay | Admitting: Internal Medicine

## 2018-10-28 ENCOUNTER — Other Ambulatory Visit: Payer: Self-pay

## 2018-10-28 VITALS — BP 125/74 | HR 54 | Temp 97.5°F | Resp 17 | Ht 73.0 in | Wt 197.0 lb

## 2018-10-28 DIAGNOSIS — Z1211 Encounter for screening for malignant neoplasm of colon: Secondary | ICD-10-CM | POA: Diagnosis not present

## 2018-10-28 DIAGNOSIS — D123 Benign neoplasm of transverse colon: Secondary | ICD-10-CM | POA: Diagnosis not present

## 2018-10-28 DIAGNOSIS — D122 Benign neoplasm of ascending colon: Secondary | ICD-10-CM

## 2018-10-28 DIAGNOSIS — Z86010 Personal history of colonic polyps: Secondary | ICD-10-CM | POA: Diagnosis not present

## 2018-10-28 HISTORY — PX: COLONOSCOPY: SHX174

## 2018-10-28 MED ORDER — SODIUM CHLORIDE 0.9 % IV SOLN: 500.0000 mL | Freq: Once | INTRAVENOUS | Status: DC

## 2018-10-28 NOTE — Progress Notes (Signed)
Pt's states no medical or surgical changes since previsit or office visit. 

## 2018-10-28 NOTE — Progress Notes (Signed)
Called to room to assist during endoscopic procedure.  Patient ID and intended procedure confirmed with present staff. Received instructions for my participation in the procedure from the performing physician.  

## 2018-10-28 NOTE — Progress Notes (Signed)
No problems noted in the recovery room. maw 

## 2018-10-28 NOTE — Progress Notes (Signed)
Spontaneous respirations throughout. VSS. Resting comfortably. To PACU on room air. Report to  RN. 

## 2018-10-28 NOTE — Patient Instructions (Addendum)
I found and removed 4 tiny polyps. I will let you know pathology results and when to have another routine colonoscopy by mail and/or My Chart.  I appreciate the opportunity to care for you. Gatha Mayer, MD, FACG    YOU HAD AN ENDOSCOPIC PROCEDURE TODAY AT [facility]:   Refer to the procedure report that was given to you for any specific questions about what was found during the examination.  If the procedure report does not answer your questions, please call your gastroenterologist to clarify.  If you requested that your care partner not be given the details of your procedure findings, then the procedure report has been included in a sealed envelope for you to review at your convenience later.  YOU SHOULD EXPECT: Some feelings of bloating in the abdomen. Passage of more gas than usual.  Walking can help get rid of the air that was put into your GI tract during the procedure and reduce the bloating. If you had a lower endoscopy (such as a colonoscopy or flexible sigmoidoscopy) you may notice spotting of blood in your stool or on the toilet paper. If you underwent a bowel prep for your procedure, you may not have a normal bowel movement for a few days.  Please Note:  You might notice some irritation and congestion in your nose or some drainage.  This is from the oxygen used during your procedure.  There is no need for concern and it should clear up in a day or so.  SYMPTOMS TO REPORT IMMEDIATELY:   Following lower endoscopy (colonoscopy or flexible sigmoidoscopy):  Excessive amounts of blood in the stool  Significant tenderness or worsening of abdominal pains  Swelling of the abdomen that is new, acute  Fever of 100F or higher  For urgent or emergent issues, a gastroenterologist can be reached at any hour by calling [phone].   DIET:  We do recommend a small meal at first, but then you may proceed to your regular diet.  Drink plenty of fluids but you  should avoid alcoholic beverages for 24 hours.  ACTIVITY:  You should plan to take it easy for the rest of today and you should NOT DRIVE or use heavy machinery until tomorrow (because of the sedation medicines used during the test).    FOLLOW UP: Our staff will call the number listed on your records the next business day following your procedure to check on you and address any questions or concerns that you may have regarding the information given to you following your procedure. If we do not reach you, we will leave a message.  However, if you are feeling well and you are not experiencing any problems, there is no need to return our call.  We will assume that you have returned to your regular daily activities without incident.  If any biopsies were taken you will be contacted by phone or by letter within the next 1-3 weeks.  Please call us at [phone] if you have not heard about the biopsies in 3 weeks.    SIGNATURES/CONFIDENTIALITY: You and/or your care partner have signed paperwork which will be entered into your electronic medical record.  These signatures attest to the fact that that the information above on your After Visit Summary has been reviewed and is understood.  Full responsibility of the confidentiality of this discharge information lies with you and/or your care-partner.    Handouts were given to your care partner on polyps, diverticulosis,  and hemorrhoids. You may resume your current medications today. Await biopsy results. Please call if any questions or concerns.

## 2018-10-28 NOTE — Op Note (Signed)
[facility] Patient Name: Terry Holt Procedure Date: 10/28/2018 10:36 AM MRN: [id number] Endoscopist: Gatha Mayer , MD Age: 63 Referring MD:  Date of Birth: 07/01/1956 Gender: Male Account #: [id number] Procedure:                Colonoscopy Indications:              Screening for colorectal malignant neoplasm, Last                            colonoscopy: 2009 Medicines:                Propofol per Anesthesia, Monitored Anesthesia Care Procedure:                Pre-Anesthesia Assessment:                           - Prior to the procedure, a History and Physical                            was performed, and patient medications and                            allergies were reviewed. The patient's tolerance of                            previous anesthesia was also reviewed. The risks                            and benefits of the procedure and the sedation                            options and risks were discussed with the patient.                            All questions were answered, and informed consent                            was obtained. Prior Anticoagulants: The patient has                            taken no previous anticoagulant or antiplatelet                            agents. ASA Grade Assessment: II - A patient with                            mild systemic disease. After reviewing the risks                            and benefits, the patient was deemed in                            satisfactory condition to undergo the procedure.  After obtaining informed consent, the colonoscope                            was passed under direct vision. Throughout the                            procedure, the patient's blood pressure, pulse, and                            oxygen saturations were monitored continuously. The                            Colonoscope was introduced through the anus and                            advanced to the  the cecum, identified by                            appendiceal orifice and ileocecal valve. The                            colonoscopy was performed without difficulty. The                            patient tolerated the procedure well. The quality                            of the bowel preparation was excellent. The                            ileocecal valve, appendiceal orifice, and rectum                            were photographed. The bowel preparation used was                            Miralax. Scope In: 10:38:37 AM Scope Out: 10:56:11 AM Scope Withdrawal Time: 0 hours 14 minutes 34 seconds  Total Procedure Duration: 0 hours 17 minutes 34 seconds  Findings:                 The perianal and digital rectal examinations were                            normal. Pertinent negatives include normal prostate                            (size, shape, and consistency).                           Four flat and sessile polyps were found in the                            transverse colon and ascending colon. The polyps  were diminutive in size. These polyps were removed                            with a cold snare. Resection and retrieval were                            complete. Verification of patient identification                            for the specimen was done. Estimated blood loss was                            minimal.                           Multiple diverticula were found in the sigmoid                            colon.                           Internal hemorrhoids were found.                           The exam was otherwise without abnormality on                            direct and retroflexion views. Complications:            No immediate complications. Estimated Blood Loss:     Estimated blood loss was minimal. Impression:               - Four diminutive polyps in the transverse colon                            and in the ascending colon,  removed with a cold                            snare. Resected and retrieved.                           - Diverticulosis in the sigmoid colon.                           - Internal hemorrhoids.                           - The examination was otherwise normal on direct                            and retroflexion views. Recommendation:           - Patient has a contact number available for                            emergencies. The signs and symptoms of potential  delayed complications were discussed with the                            patient. Return to normal activities tomorrow.                            Written discharge instructions were provided to the                            patient.                           - Resume previous diet.                           - Continue present medications.                           - Repeat colonoscopy is recommended. The                            colonoscopy date will be determined after pathology                            results from today's exam become available for                            review. Gatha Mayer, MD 10/28/2018 11:02:05 AM This report has been signed electronically.

## 2018-10-29 ENCOUNTER — Telehealth: Payer: Self-pay

## 2018-10-29 DIAGNOSIS — M1712 Unilateral primary osteoarthritis, left knee: Secondary | ICD-10-CM | POA: Diagnosis not present

## 2018-10-29 DIAGNOSIS — M170 Bilateral primary osteoarthritis of knee: Secondary | ICD-10-CM | POA: Diagnosis not present

## 2018-10-29 DIAGNOSIS — M25561 Pain in right knee: Secondary | ICD-10-CM | POA: Diagnosis not present

## 2018-10-29 NOTE — Telephone Encounter (Signed)
  Follow up Call-  Call back number 10/28/2018  Post procedure Call Back phone  # [phone]  Permission to leave phone message Yes  Some recent data might be hidden     Patient questions:  Do you have a fever, pain , or abdominal swelling? No. Pain Score  0 *  Have you tolerated food without any problems? Yes.    Have you been able to return to your normal activities? Yes.    Do you have any questions about your discharge instructions: Diet   No. Medications  No. Follow up visit  No.  Do you have questions or concerns about your Care? No.  Actions: * If pain score is 4 or above: No action needed, pain <4.

## 2018-11-01 ENCOUNTER — Encounter: Payer: Self-pay | Admitting: Internal Medicine

## 2018-11-01 DIAGNOSIS — Z860101 Personal history of adenomatous and serrated colon polyps: Secondary | ICD-10-CM

## 2018-11-01 DIAGNOSIS — Z86010 Personal history of colonic polyps: Secondary | ICD-10-CM

## 2018-11-01 HISTORY — DX: Personal history of colonic polyps: Z86.010

## 2018-11-01 HISTORY — DX: Personal history of adenomatous and serrated colon polyps: Z86.0101

## 2018-11-01 NOTE — Progress Notes (Signed)
2 diminutive adenomas 2 mucosal polyps Recall 2027 My Chart

## 2019-03-10 ENCOUNTER — Telehealth: Payer: Self-pay | Admitting: *Deleted

## 2019-03-10 NOTE — Telephone Encounter (Signed)
Patient has been scheduled

## 2019-03-10 NOTE — Telephone Encounter (Signed)
Copied from [place] [id number]. Topic: Appointment Scheduling - Scheduling Inquiry for Clinic >> Mar 10, 2019 12:23 PM Nils Flack wrote: Reason for CRM: pt would like appt for blood pressure [phone]

## 2019-03-11 ENCOUNTER — Ambulatory Visit (INDEPENDENT_AMBULATORY_CARE_PROVIDER_SITE_OTHER): Payer: 59 | Admitting: Family Medicine

## 2019-03-11 ENCOUNTER — Other Ambulatory Visit: Payer: Self-pay

## 2019-03-11 ENCOUNTER — Encounter: Payer: Self-pay | Admitting: Family Medicine

## 2019-03-11 VITALS — BP 150/84 | HR 76 | Temp 97.8°F | Wt 203.8 lb

## 2019-03-11 DIAGNOSIS — I10 Essential (primary) hypertension: Secondary | ICD-10-CM | POA: Diagnosis not present

## 2019-03-11 DIAGNOSIS — H539 Unspecified visual disturbance: Secondary | ICD-10-CM

## 2019-03-11 DIAGNOSIS — E039 Hypothyroidism, unspecified: Secondary | ICD-10-CM | POA: Diagnosis not present

## 2019-03-11 DIAGNOSIS — I159 Secondary hypertension, unspecified: Secondary | ICD-10-CM

## 2019-03-11 MED ORDER — LISINOPRIL 10 MG PO TABS: 10.0000 mg | ORAL_TABLET | Freq: Every day | ORAL | 3 refills | Status: DC

## 2019-03-11 NOTE — Progress Notes (Signed)
   Subjective:    Patient ID: Terry Holt, male    DOB: 1955/12/26, 63 y.o.   MRN: [id number]  HPI Here for several issues. First he saw Dr. Melissa Montane at [facility] yesterday to follow up oral cancer and his BP was 175/95. It has stayed elevated at home since then and again here today he admits to having frequent headaches lately but no chest pain or SOB. No swellin gin the hands and feet. Also yesterday routine labs revealed hypothyroidism, as his TSH was high at 5.46 and his free T4 was low at 0.94. he was started on Synthroid 25 mcg daily, and he saw told to follow up with [place]. Lastly he saw his optometrist, Dr. Macarthur Critchley, last week about some transient visual disturbances in the left eye, and it was recommended that Legrand Como have his carotid arteries checked. Dr. Nicki Reaper felt this was probably a migraine phenomenon, but we need to rule of carotid disease anyway.    Review of Systems  Constitutional: Negative.   Eyes: Positive for visual disturbance.  Respiratory: Negative.   Cardiovascular: Negative.   Neurological: Positive for headaches.       Objective:   Physical Exam Constitutional:      General: He is not in acute distress.    Appearance: Normal appearance.  Neck:     Vascular: No carotid bruit.  Cardiovascular:     Rate and Rhythm: Normal rate and regular rhythm.     Pulses: Normal pulses.     Heart sounds: Normal heart sounds.     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     Breath sounds: Normal breath sounds.  Musculoskeletal:     Right lower leg: No edema.     Left lower leg: No edema.  Lymphadenopathy:     Cervical: No cervical adenopathy.  Neurological:     Mental Status: He is alert.           Assessment & Plan:  For the HTN, we will start him on Lisinopril 10 mg daily. Recheck with a complete physical and labs in 3-4 weeks. We will follow the TSH. Set up carotid dopplers to rule out disease.   Alysia Penna, MD

## 2019-03-12 ENCOUNTER — Encounter (HOSPITAL_COMMUNITY): Payer: Self-pay

## 2019-03-12 ENCOUNTER — Telehealth: Payer: Self-pay | Admitting: *Deleted

## 2019-03-12 ENCOUNTER — Ambulatory Visit (HOSPITAL_COMMUNITY)
Admission: RE | Admit: 2019-03-12 | Discharge: 2019-03-12 | Disposition: A | Discharge status: Home / Self Care | Payer: 59 | Source: Ambulatory Visit | Attending: Cardiology | Admitting: Cardiology

## 2019-03-12 DIAGNOSIS — H539 Unspecified visual disturbance: Secondary | ICD-10-CM | POA: Insufficient documentation

## 2019-03-12 DIAGNOSIS — I6521 Occlusion and stenosis of right carotid artery: Secondary | ICD-10-CM

## 2019-03-12 NOTE — Telephone Encounter (Signed)
Pt is calling and he is aware the office is closed and will office tomorrow at 8 am

## 2019-03-12 NOTE — Telephone Encounter (Signed)
Copied from [place] [id number]. Topic: General - Other >> Mar 12, 2019  1:28 PM Rainey Pines A wrote: Andee Poles from [facility] called to provide lab results for patient and would like a callback at [phone]   Clinic RN spoke with Andee Poles. Per Danielle patient has right 80-99% percent stenosis and left side 1-39%. Dr. Sarajane Jews please advise

## 2019-03-12 NOTE — Progress Notes (Signed)
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   Attempted to call office to speak with Dr. Sarajane Jews or nurse, dispatcher unable to connect through after 4 attempts. Staff message sent to Dr. Sarajane Jews.   Mariane Masters, RVT

## 2019-03-13 ENCOUNTER — Encounter: Payer: Self-pay | Admitting: Family Medicine

## 2019-03-13 NOTE — Telephone Encounter (Signed)
Noted. See result note for further documentation.  

## 2019-03-13 NOTE — Telephone Encounter (Signed)
Please see my Result Note about the carotid blockage. I have referred him to Vascular Surgery. Tell him to start taking aspirin 81 mg daily.

## 2019-03-13 NOTE — Telephone Encounter (Signed)
See my other answer today. I have referred him to Vascular Surgery and I asked him to start taking an 81 mg aspirin daily

## 2019-03-16 NOTE — Telephone Encounter (Signed)
Noted. I spoke with the patient over the phone. Nothing further needed.

## 2019-03-18 ENCOUNTER — Telehealth (HOSPITAL_COMMUNITY): Payer: Self-pay | Admitting: Rehabilitation

## 2019-03-18 NOTE — Telephone Encounter (Signed)

## 2019-03-19 ENCOUNTER — Ambulatory Visit (INDEPENDENT_AMBULATORY_CARE_PROVIDER_SITE_OTHER): Payer: 59 | Admitting: Vascular Surgery

## 2019-03-19 ENCOUNTER — Ambulatory Visit (HOSPITAL_COMMUNITY)
Admission: RE | Admit: 2019-03-19 | Discharge: 2019-03-19 | Disposition: A | Discharge status: Home / Self Care | Payer: 59 | Source: Ambulatory Visit | Attending: Family | Admitting: Family

## 2019-03-19 ENCOUNTER — Other Ambulatory Visit: Payer: Self-pay | Admitting: Vascular Surgery

## 2019-03-19 ENCOUNTER — Encounter: Payer: Self-pay | Admitting: Vascular Surgery

## 2019-03-19 ENCOUNTER — Other Ambulatory Visit: Payer: Self-pay

## 2019-03-19 ENCOUNTER — Other Ambulatory Visit (HOSPITAL_COMMUNITY): Payer: Self-pay | Admitting: Family Medicine

## 2019-03-19 VITALS — BP 171/99 | HR 75 | Temp 97.3°F | Resp 16 | Ht 73.5 in | Wt 203.6 lb

## 2019-03-19 DIAGNOSIS — I6521 Occlusion and stenosis of right carotid artery: Secondary | ICD-10-CM

## 2019-03-19 MED ORDER — ATORVASTATIN CALCIUM 10 MG PO TABS: 10.0000 mg | ORAL_TABLET | Freq: Every day | ORAL | Status: DC

## 2019-03-19 MED ORDER — CLOPIDOGREL BISULFATE 75 MG PO TABS: 75.0000 mg | ORAL_TABLET | Freq: Every day | ORAL | 6 refills | Status: DC

## 2019-03-19 NOTE — Progress Notes (Signed)
REASON FOR CONSULT:    Right carotid stenosis.  The consult is requested by Dr. Alysia Penna  ASSESSMENT & PLAN:   GREATER THAN 80% RIGHT CAROTID STENOSIS: This patient has an asymptomatic greater than 80% right carotid stenosis.  Given the risk of stroke I have recommended that we address this.  He had 30 treatments of radiation therapy for cancer at the base of his tongue and for this reason he would be at increased risk for nerve injury and bleeding with surgery.  Therefore, I think he should be considered for a TCAR.  I have ordered a CT angiogram of the neck in order to determine if he is a candidate for trans-carotid stenting.  I have also started him on Lipitor 10 mg daily and Plavix.  I will have him follow-up with Dr. Ruta Hinds to discuss possible TCAR.  If he is not a candidate for carotid stenting that I think he would be a candidate for surgery with some increased risk of nerve injury and bleeding.  I have discussed with him the indications for addressing the stenosis and the potential complications associated with surgery.  We will make further recommendations pending the results of his CT of the neck.  Deitra Mayo, MD, FACS Beeper [phone] Office: [phone]   HPI:   Terry Holt is a pleasant 63 y.o. 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  He received radiation therapy and chemotherapy.  He had 30 rounds of radiation therapy total.  He was told at that time he had some mild carotid disease.  A follow-up study was obtained recently which showed the stenosis on the right was greater than 80%.  He is right-handed.  He denies any history of stroke, TIAs, expressive or receptive aphasia, or amaurosis fugax.  He was just recently started on 81 mg of aspirin daily.  He is not on a statin.  He has a remote history of tobacco use.  He has had some issues with hypertension and was recently started on  lisinopril.  His risk factors for peripheral vascular disease include hypertension, hypercholesterolemia, and a remote history of tobacco use.  He denies any history of diabetes or family history of premature cardiovascular disease.  Past Medical History:  Diagnosis Date  . 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 Chicken pox   . Concussion 01-2006   resolved  . Coronary artery disease   . Fracture    right clavicle,scapula, and finger  . Hx of adenomatous colonic polyps 11/01/2018  . Post-operative nausea and vomiting     Family History  Problem Relation Age of Onset  . Cancer Other        colon  . Hypertension Other   . Colon cancer Maternal Aunt   . Colon polyps Neg Hx   . Esophageal cancer Neg Hx   . Rectal cancer Neg Hx   . Stomach cancer Neg Hx     SOCIAL HISTORY: Social History   Socioeconomic History  . Marital status: Married    Spouse name: Not on file  . Number of children: Not on file  . Years of education: Not on file  . Highest education level: Not on file  Occupational History  . Not on file  Social Needs  . Financial resource strain: Not on file  . Food insecurity  Worry: Not on file    Inability: Not on file  . Transportation needs    Medical: Not on file    Non-medical: Not on file  Tobacco Use  . Smoking status: Former Smoker    Quit date: 1985    Years since quitting: 35.6  . Smokeless tobacco: Former Systems developer    Quit date: 1985  Substance and Sexual Activity  . Alcohol use: Yes    Alcohol/week: 6.0 standard drinks    Types: 6 Standard drinks or equivalent per week  . Drug use: No  . Sexual activity: Not on file  Lifestyle  . Physical activity    Days per week: Not on file    Minutes per session: Not on file  . Stress: Not on file  Relationships  . Social Herbalist on phone: Not on file    Gets together: Not on file    Attends religious service: Not on file    Active member of  club or organization: Not on file    Attends meetings of clubs or organizations: Not on file    Relationship status: Not on file  . Intimate partner violence    Fear of current or ex partner: Not on file    Emotionally abused: Not on file    Physically abused: Not on file    Forced sexual activity: Not on file  Other Topics Concern  . Not on file  Social History Narrative  . Not on file    Allergies  Allergen Reactions  . Valsartan Rash    Current Outpatient Medications  Medication Sig Dispense Refill  . levothyroxine (SYNTHROID) 25 MCG tablet Take 1 tablet by mouth daily.    Marland Kitchen lisinopril (ZESTRIL) 10 MG tablet Take 1 tablet (10 mg total) by mouth daily. 30 tablet 3   No current facility-administered medications for this visit.     REVIEW OF SYSTEMS:  [X]  denotes positive finding, [ ]  denotes negative finding Cardiac  Comments:  Chest pain or chest pressure:    Shortness of breath upon exertion:    Short of breath when lying flat:    Irregular heart rhythm:        Vascular    Pain in calf, thigh, or hip brought on by ambulation:    Pain in feet at night that wakes you up from your sleep:     Blood clot in your veins:    Leg swelling:         Pulmonary    Oxygen at home:    Productive cough:     Wheezing:         Neurologic    Sudden weakness in arms or legs:     Sudden numbness in arms or legs:     Sudden onset of difficulty speaking or slurred speech:    Temporary loss of vision in one eye:     Problems with dizziness:         Gastrointestinal    Blood in stool:     Vomited blood:         Genitourinary    Burning when urinating:     Blood in urine:        Psychiatric    Major depression:         Hematologic    Bleeding problems:    Problems with blood clotting too easily:        Skin    Rashes or ulcers:  Constitutional    Fever or chills:     PHYSICAL EXAM:   Vitals:   03/19/19 1121 03/19/19 1123  BP: (!) 168/88 (!) 171/99  Pulse:  75   Resp: 16   Temp: (!) 97.3 F (36.3 C)   TempSrc: Temporal   SpO2: 100%   Weight: 203 lb 9.6 oz (92.4 kg)   Height: 6' 1.5" (1.867 m)     GENERAL: The patient is a well-nourished male, in no acute distress. The vital signs are documented above. CARDIAC: There is a regular rate and rhythm.  VASCULAR: I do not detect carotid bruits. He has palpable femoral and pedal pulses bilaterally. PULMONARY: There is good air exchange bilaterally without wheezing or rales. ABDOMEN: Soft and non-tender with normal pitched bowel sounds.  MUSCULOSKELETAL: There are no major deformities or cyanosis. NEUROLOGIC: No focal weakness or paresthesias are detected. SKIN: There are no ulcers or rashes noted. PSYCHIATRIC: The patient has a normal affect.  DATA:    DUPLEX CAROTIDS: I reviewed the carotid duplex scan that was done on 03/12/2019.  This showed a greater than 80% right carotid stenosis involving the proximal right internal carotid artery.  There was no significant stenosis on the left.  Both vertebral arteries were patent with antegrade flow.  LIMITED CAROTID DUPLEX: I have independently interpreted his limited carotid duplex scan today of the right carotid artery.  This shows a greater than 80% right carotid stenosis in the proximal right internal carotid artery.  LABS: The most recent labs I have were in October 2018.  His creatinine at that time was 0.98.

## 2019-03-24 ENCOUNTER — Ambulatory Visit
Admission: RE | Admit: 2019-03-24 | Discharge: 2019-03-24 | Disposition: A | Discharge status: Home / Self Care | Payer: 59 | Source: Ambulatory Visit | Attending: Vascular Surgery | Admitting: Vascular Surgery

## 2019-03-24 DIAGNOSIS — I6521 Occlusion and stenosis of right carotid artery: Secondary | ICD-10-CM

## 2019-03-24 MED ORDER — IOPAMIDOL (ISOVUE-370) INJECTION 76%
75.0000 mL | Freq: Once | INTRAVENOUS | Status: AC | PRN
  Administered 2019-03-24: 75 mL via INTRAVENOUS

## 2019-03-26 ENCOUNTER — Other Ambulatory Visit: Payer: Self-pay

## 2019-03-26 ENCOUNTER — Ambulatory Visit (INDEPENDENT_AMBULATORY_CARE_PROVIDER_SITE_OTHER): Payer: 59 | Admitting: Vascular Surgery

## 2019-03-26 ENCOUNTER — Encounter: Payer: Self-pay | Admitting: Vascular Surgery

## 2019-03-26 VITALS — BP 134/87 | HR 78 | Temp 97.6°F | Resp 20 | Ht 73.5 in | Wt 202.0 lb

## 2019-03-26 DIAGNOSIS — I6521 Occlusion and stenosis of right carotid artery: Secondary | ICD-10-CM | POA: Diagnosis not present

## 2019-03-26 MED ORDER — ATORVASTATIN CALCIUM 10 MG PO TABS: 10.0000 mg | ORAL_TABLET | Freq: Every day | ORAL | 5 refills | Status: DC

## 2019-03-26 NOTE — Progress Notes (Signed)
Patient is a 63 year old male who returns for follow-up today.  He was recently seen by my partner Dr. Carlean Jews for evaluation of asymptomatic right internal carotid artery stenosis.  The patient had radiation to the right side of his neck approximately 4 years ago.  Carotid stenosis was recently discovered on duplex ultrasound.  It was greater than 80%.  Recent CT angiogram confirmed this finding.  He is here today for evaluation for TCAR in light of his previous radiation exposure.  He continues to have no symptoms of TIA amaurosis or stroke.  He has been started on Plavix and aspirin.  He is going to pick up his statin prescription today.  He states he has had fairly significant nausea and vomiting after general anesthesia in the past.  He does have a remote smoking history but over 30 years ago.  Past Medical History:  Diagnosis Date  . 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 Chicken pox   . Concussion 01-2006   resolved  . Coronary artery disease   . Fracture    right clavicle,scapula, and finger  . Hx of adenomatous colonic polyps 11/01/2018  . Hyperlipidemia   . Hypertension   . Post-operative nausea and vomiting     Past Surgical History:  Procedure Laterality Date  . arthroscopic knees     bilateral  . COLONOSCOPY  05-28-08   per Dr. [name], clear, repeat in 10 yrs   . INGUINAL HERNIA REPAIR Left 05/05/2015   Procedure: LAPAROSCOPIC INGUINAL HERNIA;  Surgeon: [name], MD;  Location: [facility];  Service: General;  Laterality: Left;  . INSERTION OF MESH Left 05/05/2015   Procedure: INSERTION OF MESH;  Surgeon: [name], MD;  Location: [facility];  Service: General;  Laterality: Left;    Current Outpatient Medications on File Prior to Visit  Medication Sig Dispense Refill  . aspirin (ASPIRIN 81) 81 MG chewable tablet     . clopidogrel (PLAVIX) 75 MG tablet Take 1 tablet (75 mg total) by mouth  daily. 30 tablet 6  . levothyroxine (SYNTHROID) 25 MCG tablet Take 1 tablet by mouth daily.    Marland Kitchen lisinopril (ZESTRIL) 10 MG tablet Take 1 tablet (10 mg total) by mouth daily. 30 tablet 3   No current facility-administered medications on file prior to visit.     Allergies  Allergen Reactions  . Valsartan Rash    Review of systems: He has no shortness of breath.  He has no chest pain.  Physical exam:  Vitals:   03/26/19 1406 03/26/19 1409  BP: 130/82 134/87  Pulse: 78   Resp: 20   Temp: 97.6 F (36.4 C)   TempSrc: Temporal   SpO2: 98%   Weight: 202 lb (91.6 kg)   Height: 6' 1.5" (1.867 m)     Neuro: No facial asymmetry symmetric upper extremity lower extremity motor strength 5/5  Vascular: 2+ carotid pulses, 2+ radial pulses  Data: I reviewed the patient's CT angiogram images today.  This shows a greater than 75% right internal carotid artery stenosis with moderate calcification.  No significant left internal carotid artery stenosis  Recent duplex ultrasound shows high-grade greater than 80% right internal carotid artery stenosis with velocities of the right internal carotid artery of 380/147  Assessment: Asymptomatic greater than 80% right internal carotid artery stenosis most likely secondary to radiation  Plan: Patient will be scheduled for TCAR April 20, 2019.  Risk benefits  possible complications and procedure details were discussed with the patient today including not limited to bleeding infection vessel injury risk of stroke 1% risk of cranial nerve injury less than 5%  He understands and agrees to proceed  Ruta Hinds, MD [facility]: [phone] Pager: [phone]

## 2019-03-26 NOTE — H&P (View-Only) (Signed)
Patient is a 63 year old male who returns for follow-up today.  He was recently seen by my partner Dr. Carlean Jews for evaluation of asymptomatic right internal carotid artery stenosis.  The patient had radiation to the right side of his neck approximately 4 years ago.  Carotid stenosis was recently discovered on duplex ultrasound.  It was greater than 80%.  Recent CT angiogram confirmed this finding.  He is here today for evaluation for TCAR in light of his previous radiation exposure.  He continues to have no symptoms of TIA amaurosis or stroke.  He has been started on Plavix and aspirin.  He is going to pick up his statin prescription today.  He states he has had fairly significant nausea and vomiting after general anesthesia in the past.  He does have a remote smoking history but over 30 years ago.  Past Medical History:  Diagnosis Date  . 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 Chicken pox   . Concussion 01-2006   resolved  . Coronary artery disease   . Fracture    right clavicle,scapula, and finger  . Hx of adenomatous colonic polyps 11/01/2018  . Hyperlipidemia   . Hypertension   . Post-operative nausea and vomiting     Past Surgical History:  Procedure Laterality Date  . arthroscopic knees     bilateral  . COLONOSCOPY  05-28-08   per Dr. [name], clear, repeat in 10 yrs   . INGUINAL HERNIA REPAIR Left 05/05/2015   Procedure: LAPAROSCOPIC INGUINAL HERNIA;  Surgeon: [name], MD;  Location: [facility];  Service: General;  Laterality: Left;  . INSERTION OF MESH Left 05/05/2015   Procedure: INSERTION OF MESH;  Surgeon: [name], MD;  Location: [facility];  Service: General;  Laterality: Left;    Current Outpatient Medications on File Prior to Visit  Medication Sig Dispense Refill  . aspirin (ASPIRIN 81) 81 MG chewable tablet     . clopidogrel (PLAVIX) 75 MG tablet Take 1 tablet (75 mg total) by mouth  daily. 30 tablet 6  . levothyroxine (SYNTHROID) 25 MCG tablet Take 1 tablet by mouth daily.    Marland Kitchen lisinopril (ZESTRIL) 10 MG tablet Take 1 tablet (10 mg total) by mouth daily. 30 tablet 3   No current facility-administered medications on file prior to visit.     Allergies  Allergen Reactions  . Valsartan Rash    Review of systems: He has no shortness of breath.  He has no chest pain.  Physical exam:  Vitals:   03/26/19 1406 03/26/19 1409  BP: 130/82 134/87  Pulse: 78   Resp: 20   Temp: 97.6 F (36.4 C)   TempSrc: Temporal   SpO2: 98%   Weight: 202 lb (91.6 kg)   Height: 6' 1.5" (1.867 m)     Neuro: No facial asymmetry symmetric upper extremity lower extremity motor strength 5/5  Vascular: 2+ carotid pulses, 2+ radial pulses  Data: I reviewed the patient's CT angiogram images today.  This shows a greater than 75% right internal carotid artery stenosis with moderate calcification.  No significant left internal carotid artery stenosis  Recent duplex ultrasound shows high-grade greater than 80% right internal carotid artery stenosis with velocities of the right internal carotid artery of 380/147  Assessment: Asymptomatic greater than 80% right internal carotid artery stenosis most likely secondary to radiation  Plan: Patient will be scheduled for TCAR April 20, 2019.  Risk benefits  possible complications and procedure details were discussed with the patient today including not limited to bleeding infection vessel injury risk of stroke 1% risk of cranial nerve injury less than 5%  He understands and agrees to proceed  Ruta Hinds, MD [facility]: [phone] Pager: [phone]

## 2019-03-27 ENCOUNTER — Encounter: Payer: Self-pay | Admitting: Family Medicine

## 2019-03-30 MED ORDER — AMLODIPINE BESYLATE 5 MG PO TABS: 5.0000 mg | ORAL_TABLET | Freq: Every day | ORAL | 2 refills | Status: DC

## 2019-03-30 NOTE — Telephone Encounter (Signed)
Stop the Lisinopril and start on Amlodipine 5 mg daily. Call in #30 with 2 rf. As for the physical, we can still do this as planned

## 2019-04-01 ENCOUNTER — Other Ambulatory Visit: Payer: Self-pay | Admitting: *Deleted

## 2019-04-01 ENCOUNTER — Telehealth: Payer: Self-pay | Admitting: *Deleted

## 2019-04-01 NOTE — Telephone Encounter (Signed)
   Primary Cardiologist:No primary care provider on file.  Chart reviewed as part of pre-operative protocol coverage. This patient has not been seen by cardiology previously an will need a new patient visit for pre op clearance. Because of Terry Holt past medical history and time since last visit, he/she will require a follow-up visit in order to better assess preoperative cardiovascular risk.  Pre-op covering staff: - Please schedule appointment and call patient to inform them. - Please contact requesting surgeon's office via preferred method (i.e, phone, fax) to inform them of need for appointment prior to surgery.  If applicable, this message will also be routed to pharmacy pool and/or primary cardiologist for input on holding anticoagulant/antiplatelet agent as requested below so that this information is available at time of patient's appointment.   [name], PA-C  04/01/2019, 4:22 PM

## 2019-04-01 NOTE — Telephone Encounter (Signed)
1) What type of surgery is being performed?   RIGHT TRANSCARTOTID ARTERY REVASCULARIZATION  2) When is this surgery scheduled? April 20, 2019  3) What type of clearance is required (Medical, Pharmacy or Both)? CARDIAC CLEARANCE  4) Are there any medications that need to be held prior to surgery and how long? NO  5) Practice name and name of physician performing surgery? DR. Juanda Crumble [facility]   6) What is your office phone number?  Leota Jacobsen, RN Surgical / Triage Nurse [facility]   [phone]

## 2019-04-02 NOTE — Telephone Encounter (Signed)
1st attempt to reach pt to schedule appt. No answer

## 2019-04-03 NOTE — Telephone Encounter (Signed)
Spoke with patient and scheduled him an appt for cardiac clearance. Patient voiced understanding.

## 2019-04-09 ENCOUNTER — Encounter: Payer: Self-pay | Admitting: Family Medicine

## 2019-04-09 ENCOUNTER — Ambulatory Visit (INDEPENDENT_AMBULATORY_CARE_PROVIDER_SITE_OTHER): Payer: 59 | Admitting: Family Medicine

## 2019-04-09 ENCOUNTER — Other Ambulatory Visit: Payer: Self-pay

## 2019-04-09 VITALS — BP 140/80 | HR 72 | Temp 97.6°F | Wt 203.8 lb

## 2019-04-09 DIAGNOSIS — Z0000 Encounter for general adult medical examination without abnormal findings: Secondary | ICD-10-CM | POA: Diagnosis not present

## 2019-04-09 LAB — HEPATIC FUNCTION PANEL
ALT: 17 U/L (ref 0–53)
AST: 26 U/L (ref 0–37)
Albumin: 4.7 g/dL (ref 3.5–5.2)
Alkaline Phosphatase: 54 U/L (ref 39–117)
Bilirubin, Direct: 0.2 mg/dL (ref 0.0–0.3)
Total Bilirubin: 0.7 mg/dL (ref 0.2–1.2)
Total Protein: 6.8 g/dL (ref 6.0–8.3)

## 2019-04-09 LAB — BASIC METABOLIC PANEL
BUN: 16 mg/dL (ref 6–23)
CO2: 28 mEq/L (ref 19–32)
Calcium: 9.6 mg/dL (ref 8.4–10.5)
Chloride: 99 mEq/L (ref 96–112)
Creatinine, Ser: 0.89 mg/dL (ref 0.40–1.50)
GFR: 86.68 mL/min (ref >60.00)
Glucose, Bld: 87 mg/dL (ref 70–99)
Potassium: 3.9 mEq/L (ref 3.5–5.1)
Sodium: 138 mEq/L (ref 135–145)

## 2019-04-09 LAB — CBC WITH DIFFERENTIAL/PLATELET
Basophils Absolute: 0 10*3/uL (ref 0.0–0.1)
Basophils Relative: 0.3 % (ref 0.0–3.0)
Eosinophils Absolute: 0.1 10*3/uL (ref 0.0–0.7)
Eosinophils Relative: 1.4 % (ref 0.0–5.0)
HCT: 43 % (ref 39.0–52.0)
Hemoglobin: 15.2 g/dL (ref 13.0–17.0)
Lymphocytes Relative: 14 % (ref 12.0–46.0)
Lymphs Abs: 1 10*3/uL (ref 0.7–4.0)
MCHC: 35.3 g/dL (ref 30.0–36.0)
MCV: 96.7 fl (ref 78.0–100.0)
Monocytes Absolute: 0.7 10*3/uL (ref 0.1–1.0)
Monocytes Relative: 9.9 % (ref 3.0–12.0)
Neutro Abs: 5.2 10*3/uL (ref 1.4–7.7)
Neutrophils Relative %: 74.4 % (ref 43.0–77.0)
Platelets: 212 10*3/uL (ref 150.0–400.0)
RBC: 4.44 Mil/uL (ref 4.22–5.81)
RDW: 13.1 % (ref 11.5–15.5)
WBC: 7 10*3/uL (ref 4.0–10.5)

## 2019-04-09 LAB — PSA: PSA: 1.09 ng/mL (ref 0.10–4.00)

## 2019-04-09 LAB — POC URINALSYSI DIPSTICK (AUTOMATED)
Bilirubin, UA: NEGATIVE
Blood, UA: NEGATIVE
Glucose, UA: NEGATIVE
Ketones, UA: NEGATIVE
Leukocytes, UA: NEGATIVE
Nitrite, UA: NEGATIVE
Protein, UA: NEGATIVE
Spec Grav, UA: 1.015 (ref 1.010–1.025)
Urobilinogen, UA: 0.2 E.U./dL
pH, UA: 6 (ref 5.0–8.0)

## 2019-04-09 LAB — LIPID PANEL
Cholesterol: 159 mg/dL (ref 0–200)
HDL: 77.1 mg/dL (ref >39.00)
LDL Cholesterol: 74 mg/dL (ref 0–99)
NonHDL: 81.95
Total CHOL/HDL Ratio: 2
Triglycerides: 38 mg/dL (ref 0.0–149.0)
VLDL: 7.6 mg/dL (ref 0.0–40.0)

## 2019-04-09 LAB — TSH: TSH: 3.97 u[IU]/mL (ref 0.35–4.50)

## 2019-04-09 MED ORDER — CLOPIDOGREL BISULFATE 75 MG PO TABS: 75.0000 mg | ORAL_TABLET | Freq: Every day | ORAL | 3 refills | Status: DC

## 2019-04-09 MED ORDER — AMLODIPINE BESYLATE 5 MG PO TABS: 5.0000 mg | ORAL_TABLET | Freq: Every day | ORAL | 3 refills | Status: DC

## 2019-04-09 MED ORDER — ATORVASTATIN CALCIUM 10 MG PO TABS: 10.0000 mg | ORAL_TABLET | Freq: Every day | ORAL | 3 refills | Status: DC

## 2019-04-09 MED ORDER — LEVOTHYROXINE SODIUM 25 MCG PO TABS: 25.0000 ug | ORAL_TABLET | Freq: Every day | ORAL | 3 refills | Status: DC

## 2019-04-09 NOTE — Progress Notes (Signed)
   Subjective:    Patient ID: Terry Holt, male    DOB: Aug 01, 1956, 63 y.o.   MRN: [id number]  HPI Here for a well exam. He feels fine. He is scheduled for a right carotid endarterectomy per Dr. Ruta Hinds on 04-20-19. He denies any neurologic deficits lately.    Review of Systems  Constitutional: Negative.   HENT: Negative.   Eyes: Negative.   Respiratory: Negative.   Cardiovascular: Negative.   Gastrointestinal: Negative.   Genitourinary: Negative.   Musculoskeletal: Negative.   Skin: Negative.   Neurological: Negative.   Psychiatric/Behavioral: Negative.        Objective:   Physical Exam Constitutional:      General: He is not in acute distress.    Appearance: He is well-developed. He is not diaphoretic.  HENT:     Head: Normocephalic and atraumatic.     Right Ear: External ear normal.     Left Ear: External ear normal.     Nose: Nose normal.     Mouth/Throat:     Pharynx: No oropharyngeal exudate.  Eyes:     General: No scleral icterus.       Right eye: No discharge.        Left eye: No discharge.     Conjunctiva/sclera: Conjunctivae normal.     Pupils: Pupils are equal, round, and reactive to light.  Neck:     Musculoskeletal: Neck supple.     Thyroid: No thyromegaly.     Vascular: No JVD.     Trachea: No tracheal deviation.  Cardiovascular:     Rate and Rhythm: Normal rate and regular rhythm.     Heart sounds: Normal heart sounds. No murmur. No friction rub. No gallop.   Pulmonary:     Effort: Pulmonary effort is normal. No respiratory distress.     Breath sounds: Normal breath sounds. No wheezing or rales.  Chest:     Chest wall: No tenderness.  Abdominal:     General: Bowel sounds are normal. There is no distension.     Palpations: Abdomen is soft. There is no mass.     Tenderness: There is no abdominal tenderness. There is no guarding or rebound.  Genitourinary:    Penis: Normal. No tenderness.      Prostate: Normal.     Rectum: Normal.  Guaiac result negative.  Musculoskeletal: Normal range of motion.        General: No tenderness.  Lymphadenopathy:     Cervical: No cervical adenopathy.  Skin:    General: Skin is warm and dry.     Coloration: Skin is not pale.     Findings: No erythema or rash.  Neurological:     Mental Status: He is alert and oriented to person, place, and time.     Cranial Nerves: No cranial nerve deficit.     Motor: No abnormal muscle tone.     Coordination: Coordination normal.     Deep Tendon Reflexes: Reflexes are normal and symmetric. Reflexes normal.  Psychiatric:        Behavior: Behavior normal.        Thought Content: Thought content normal.        Judgment: Judgment normal.           Assessment & Plan:  Well exam. We discussed diet and exercise. Get fasting labs.  Alysia Penna, MD

## 2019-04-14 ENCOUNTER — Encounter (HOSPITAL_COMMUNITY): Payer: 59

## 2019-04-14 ENCOUNTER — Encounter: Payer: 59 | Admitting: Vascular Surgery
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
## 2019-04-15 ENCOUNTER — Ambulatory Visit (INDEPENDENT_AMBULATORY_CARE_PROVIDER_SITE_OTHER): Payer: 59 | Admitting: Adult Health

## 2019-04-15 ENCOUNTER — Other Ambulatory Visit: Payer: Self-pay

## 2019-04-15 ENCOUNTER — Encounter: Payer: Self-pay | Admitting: Adult Health

## 2019-04-15 VITALS — BP 158/90 | HR 63 | Ht 73.5 in | Wt 203.4 lb

## 2019-04-15 DIAGNOSIS — E7800 Pure hypercholesterolemia, unspecified: Secondary | ICD-10-CM

## 2019-04-15 DIAGNOSIS — I6521 Occlusion and stenosis of right carotid artery: Secondary | ICD-10-CM | POA: Diagnosis not present

## 2019-04-15 DIAGNOSIS — I10 Essential (primary) hypertension: Secondary | ICD-10-CM

## 2019-04-15 DIAGNOSIS — Z01810 Encounter for preprocedural cardiovascular examination: Secondary | ICD-10-CM

## 2019-04-15 DIAGNOSIS — E785 Hyperlipidemia, unspecified: Secondary | ICD-10-CM

## 2019-04-15 MED ORDER — ATORVASTATIN CALCIUM 20 MG PO TABS: 20.0000 mg | ORAL_TABLET | Freq: Every day | ORAL | 6 refills | Status: DC

## 2019-04-15 NOTE — Pre-Procedure Instructions (Signed)
MARTAVION BEECK  04/15/2019      [id number] [address], [place] [address] Phone: [phone] Fax: [phone]    Your procedure is scheduled on Aug. 31  Report to [facility] Entrance A at 5:30 A.M.  Call this number if you have problems the morning of surgery:  [phone]   Remember:  Do not eat or drink after midnight.      Take these medicines the morning of surgery with A SIP OF WATER :               Amlodipine (norvasc)              Aspirin              Levothyroxine (synthroid)                 7 days prior to surgery STOP taking  Aleve, Naproxen, Ibuprofen, Motrin, Advil, Goody's, BC's, all herbal medications, fish oil, and all vitamins.                Follow your surgeon's instructions on when to stop plavix.  If no instructions were given by your surgeon then you will need to call the office to get those instructions.               Do not wear jewelry.  Do not wear lotions, powders, or perfumes, or deodorant.  Do not shave 48 hours prior to surgery.  Men may shave face and neck.  Do not bring valuables to the hospital.  [facility] is not responsible for any belongings or valuables.  Contacts, dentures or bridgework may not be worn into surgery.  Leave your suitcase in the car.  After surgery it may be brought to your room.  For patients admitted to the hospital, discharge time will be determined by your treatment team.  Patients discharged the day of surgery will not be allowed to drive home.    Special instructions:  [facility]- Preparing For Surgery  Before surgery, you can play an important role. Because skin is not sterile, your skin needs to be as free of germs as possible. You can reduce the number of germs on your skin by washing with CHG (chlorahexidine gluconate) Soap before surgery.  CHG is an antiseptic cleaner which kills germs and bonds with the skin to continue killing  germs even after washing.    Oral Hygiene is also important to reduce your risk of infection.  Remember - BRUSH YOUR TEETH THE MORNING OF SURGERY WITH YOUR REGULAR TOOTHPASTE  Please do not use if you have an allergy to CHG or antibacterial soaps. If your skin becomes reddened/irritated stop using the CHG.  Do not shave (including legs and underarms) for at least 48 hours prior to first CHG shower. It is OK to shave your face.  Please follow these instructions carefully.   1. Shower the NIGHT BEFORE SURGERY and the MORNING OF SURGERY with CHG.   2. If you chose to wash your hair, wash your hair first as usual with your normal shampoo.  3. After you shampoo, rinse your hair and body thoroughly to remove the shampoo.  4. Use CHG as you would any other liquid soap. You can apply CHG directly to the skin and wash gently with a scrungie or a clean washcloth.   5. Apply the CHG Soap to your body ONLY FROM THE NECK DOWN.  Do not use on open wounds or open sores. Avoid contact with your eyes, ears, mouth and genitals (private parts). Wash Face and genitals (private parts)  with your normal soap.  6. Wash thoroughly, paying special attention to the area where your surgery will be performed.  7. Thoroughly rinse your body with warm water from the neck down.  8. DO NOT shower/wash with your normal soap after using and rinsing off the CHG Soap.  9. Pat yourself dry with a CLEAN TOWEL.  10. Wear CLEAN PAJAMAS to bed the night before surgery, wear comfortable clothes the morning of surgery  11. Place CLEAN SHEETS on your bed the night of your first shower and DO NOT SLEEP WITH PETS.    Day of Surgery:  Do not apply any deodorants/lotions.  Please wear clean clothes to the hospital/surgery center.   Remember to brush your teeth WITH YOUR REGULAR TOOTHPASTE.    Please read over the following fact sheets that you were given. Coughing and Deep Breathing, MRSA Information and Surgical Site  Infection Prevention

## 2019-04-15 NOTE — Patient Instructions (Signed)
Medication Instructions:  INCREASE- Atorvastatin 20 mg by mouth daily  If you need a refill on your cardiac medications before your next appointment, please call your pharmacy.  Labwork: Fasting Lipids and Liver in 3 months HERE IN OUR OFFICE AT [facility]  You will need to fast. DO NOT EAT OR DRINK PAST MIDNIGHT.     Take the provided lab slips with you to the lab for your blood draw.   When you have your labs (blood work) drawn today and your tests are completely normal, you will receive your results only by MyChart Message (if you have MyChart) -OR-  A paper copy in the mail.  If you have any lab test that is abnormal or we need to change your treatment, we will call you to review these results.  Testing/Procedures: None Ordered  Follow-Up: You will need a follow up appointment in 3 months.  Please call our office 2 months in advance to schedule this appointment.  You may see Dr [name] or one of the following Advanced Practice Providers on your designated Care Team: Almyra Deforest, [place] . Fabian Sharp, PA-C     At [facility], you and your health needs are our priority.  As part of our continuing mission to provide you with exceptional heart care, we have created designated Provider Care Teams.  These Care Teams include your primary Cardiologist (physician) and Advanced Practice Providers (APPs -  Physician Assistants and Nurse Practitioners) who all work together to provide you with the care you need, when you need it.  Thank you for choosing [facility] at [facility]!!

## 2019-04-16 ENCOUNTER — Other Ambulatory Visit: Payer: Self-pay

## 2019-04-16 ENCOUNTER — Encounter (HOSPITAL_COMMUNITY)
Admission: RE | Admit: 2019-04-16 | Discharge: 2019-04-16 | Disposition: A | Discharge status: Home / Self Care | Payer: 59 | Source: Ambulatory Visit | Attending: Vascular Surgery | Admitting: Vascular Surgery

## 2019-04-16 ENCOUNTER — Encounter (HOSPITAL_COMMUNITY): Payer: Self-pay

## 2019-04-16 ENCOUNTER — Other Ambulatory Visit (HOSPITAL_COMMUNITY)
Admission: RE | Admit: 2019-04-16 | Discharge: 2019-04-16 | Disposition: A | Discharge status: Home / Self Care | Payer: 59 | Source: Ambulatory Visit | Attending: Vascular Surgery | Admitting: Vascular Surgery

## 2019-04-16 DIAGNOSIS — Z20828 Contact with and (suspected) exposure to other viral communicable diseases: Secondary | ICD-10-CM | POA: Insufficient documentation

## 2019-04-16 DIAGNOSIS — Z01812 Encounter for preprocedural laboratory examination: Secondary | ICD-10-CM | POA: Insufficient documentation

## 2019-04-16 LAB — TYPE AND SCREEN
ABO/RH(D): O POS
Antibody Screen: NEGATIVE

## 2019-04-16 LAB — PROTIME-INR
INR: 1.1 (ref 0.8–1.2)
Prothrombin Time: 13.8 seconds (ref 11.4–15.2)

## 2019-04-16 LAB — CBC
HCT: 42.2 % (ref 39.0–52.0)
Hemoglobin: 14.9 g/dL (ref 13.0–17.0)
MCH: 33.8 pg (ref 26.0–34.0)
MCHC: 35.3 g/dL (ref 30.0–36.0)
MCV: 95.7 fL (ref 80.0–100.0)
Platelets: 199 10*3/uL (ref 150–400)
RBC: 4.41 MIL/uL (ref 4.22–5.81)
RDW: 12.9 % (ref 11.5–15.5)
WBC: 4.9 10*3/uL (ref 4.0–10.5)
nRBC: 0 % (ref 0.0–0.2)

## 2019-04-16 LAB — COMPREHENSIVE METABOLIC PANEL
ALT: 20 U/L (ref 0–44)
AST: 30 U/L (ref 15–41)
Albumin: 4 g/dL (ref 3.5–5.0)
Alkaline Phosphatase: 50 U/L (ref 38–126)
Anion gap: 11 (ref 5–15)
BUN: 14 mg/dL (ref 8–23)
CO2: 26 mmol/L (ref 22–32)
Calcium: 9.4 mg/dL (ref 8.9–10.3)
Chloride: 101 mmol/L (ref 98–111)
Creatinine, Ser: 0.95 mg/dL (ref 0.61–1.24)
GFR calc Af Amer: >60 mL/min (ref >60)
GFR calc non Af Amer: >60 mL/min (ref >60)
Glucose, Bld: 79 mg/dL (ref 70–99)
Potassium: 3.7 mmol/L (ref 3.5–5.1)
Sodium: 138 mmol/L (ref 135–145)
Total Bilirubin: 1 mg/dL (ref 0.3–1.2)
Total Protein: 6.6 g/dL (ref 6.5–8.1)

## 2019-04-16 LAB — URINALYSIS, ROUTINE W REFLEX MICROSCOPIC
Bilirubin Urine: NEGATIVE
Glucose, UA: NEGATIVE mg/dL
Hgb urine dipstick: NEGATIVE
Ketones, ur: NEGATIVE mg/dL
Leukocytes,Ua: NEGATIVE
Nitrite: NEGATIVE
Protein, ur: NEGATIVE mg/dL
Specific Gravity, Urine: 1.013 (ref 1.005–1.030)
pH: 7 (ref 5.0–8.0)

## 2019-04-16 LAB — APTT: aPTT: 33 seconds (ref 24–36)

## 2019-04-16 LAB — SURGICAL PCR SCREEN
MRSA, PCR: NEGATIVE
Staphylococcus aureus: NEGATIVE

## 2019-04-16 LAB — SARS CORONAVIRUS 2 (TAT 6-24 HRS): SARS Coronavirus 2: NEGATIVE

## 2019-04-16 LAB — ABO/RH: ABO/RH(D): O POS

## 2019-04-16 NOTE — Progress Notes (Addendum)
  Coronavirus Screening test scheduled for today Have you experienced the following symptoms:  Cough yes/no: No Fever (>100.41F)  yes/no: No Runny nose yes/no: No Sore throat yes/no: No Difficulty breathing/shortness of breath  yes/no: No Loss of smell or taste-No Have you or a family member traveled in the last 14 days and where? yes/no: No  PCP - Dr  Alysia Penna  Otolaryngology-Dr [facility]  Cardiologist - Dr Croitoru(first visit yesterday)  Chest x-ray -NA   EKG - 03-11-19. Had another EKG yesterday at Dr. Victorino December office. Was told, tracing will be accessible to the surgeon. Per pt, no changes noted on EKG.   Stress Test - 11-17-14  ECHO - denies  Cardiac Cath - 07-3  AICD-denies PM-denies LOOP-denies  Sleep Study - No CPAP - denies  LABS- Plavix , ASA-81mg =Pt instructed by Dr. Nona Dell office to continue taking it, including DOS PCR CBC,CMP,T/S, APTT,PT-INR  ERAS-NA HA1C-denies Fasting Blood Sugar -  Checks Blood Sugar __0_ times a day  Anesthesia-Y. HTN, Thoracic outlet sy., CAD  Pt denies having chest pain, sob, or fever at this time. All instructions explained to the pt, with a verbal understanding of the material. Pt agrees to go over the instructions while at home for a better understanding. Pt also instructed to self quarantine after being tested for COVID-19. The opportunity to ask questions was provided.
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
## 2019-04-20 ENCOUNTER — Other Ambulatory Visit: Payer: Self-pay

## 2019-04-20 ENCOUNTER — Inpatient Hospital Stay (HOSPITAL_COMMUNITY)
Admission: RE | Admit: 2019-04-20 | Discharge: 2019-04-21 | DRG: 036 | Disposition: A | Discharge status: Home / Self Care | Payer: 59 | Attending: Vascular Surgery | Admitting: Vascular Surgery

## 2019-04-20 ENCOUNTER — Encounter (HOSPITAL_COMMUNITY)
Admission: RE | Disposition: A | Discharge status: Home / Self Care | Payer: Self-pay | Source: Home / Self Care | Attending: Vascular Surgery

## 2019-04-20 ENCOUNTER — Inpatient Hospital Stay (HOSPITAL_COMMUNITY): Payer: 59 | Admitting: Certified Registered Nurse Anesthetist

## 2019-04-20 ENCOUNTER — Inpatient Hospital Stay (HOSPITAL_COMMUNITY): Payer: 59

## 2019-04-20 ENCOUNTER — Encounter (HOSPITAL_COMMUNITY): Payer: Self-pay

## 2019-04-20 ENCOUNTER — Inpatient Hospital Stay (HOSPITAL_COMMUNITY): Payer: 59 | Admitting: Physician Assistant

## 2019-04-20 DIAGNOSIS — Z9221 Personal history of antineoplastic chemotherapy: Secondary | ICD-10-CM | POA: Diagnosis not present

## 2019-04-20 DIAGNOSIS — I10 Essential (primary) hypertension: Secondary | ICD-10-CM | POA: Diagnosis present

## 2019-04-20 DIAGNOSIS — Z7902 Long term (current) use of antithrombotics/antiplatelets: Secondary | ICD-10-CM

## 2019-04-20 DIAGNOSIS — Z87891 Personal history of nicotine dependence: Secondary | ICD-10-CM | POA: Diagnosis not present

## 2019-04-20 DIAGNOSIS — E039 Hypothyroidism, unspecified: Secondary | ICD-10-CM | POA: Diagnosis present

## 2019-04-20 DIAGNOSIS — Z79899 Other long term (current) drug therapy: Secondary | ICD-10-CM

## 2019-04-20 DIAGNOSIS — I6529 Occlusion and stenosis of unspecified carotid artery: Secondary | ICD-10-CM | POA: Diagnosis present

## 2019-04-20 DIAGNOSIS — I2510 Atherosclerotic heart disease of native coronary artery without angina pectoris: Secondary | ICD-10-CM | POA: Diagnosis present

## 2019-04-20 DIAGNOSIS — Z7982 Long term (current) use of aspirin: Secondary | ICD-10-CM | POA: Diagnosis not present

## 2019-04-20 DIAGNOSIS — Z79890 Hormone replacement therapy (postmenopausal): Secondary | ICD-10-CM

## 2019-04-20 DIAGNOSIS — Z923 Personal history of irradiation: Secondary | ICD-10-CM | POA: Diagnosis not present

## 2019-04-20 DIAGNOSIS — Z85810 Personal history of malignant neoplasm of tongue: Secondary | ICD-10-CM

## 2019-04-20 DIAGNOSIS — Z888 Allergy status to other drugs, medicaments and biological substances status: Secondary | ICD-10-CM

## 2019-04-20 DIAGNOSIS — I6521 Occlusion and stenosis of right carotid artery: Principal | ICD-10-CM | POA: Diagnosis present

## 2019-04-20 HISTORY — PX: TRANSCAROTID ARTERY REVASCULARIZATION (TCAR): SHX6784

## 2019-04-20 HISTORY — PX: TRANSCAROTID ARTERY REVASCULARIZATION??: SHX6778

## 2019-04-20 LAB — CBC
HCT: 38.1 % — ABNORMAL LOW (ref 39.0–52.0)
Hemoglobin: 13.3 g/dL (ref 13.0–17.0)
MCH: 33.8 pg (ref 26.0–34.0)
MCHC: 34.9 g/dL (ref 30.0–36.0)
MCV: 96.7 fL (ref 80.0–100.0)
Platelets: 159 10*3/uL (ref 150–400)
RBC: 3.94 MIL/uL — ABNORMAL LOW (ref 4.22–5.81)
RDW: 12.9 % (ref 11.5–15.5)
WBC: 6.9 10*3/uL (ref 4.0–10.5)
nRBC: 0 % (ref 0.0–0.2)

## 2019-04-20 LAB — CREATININE, SERUM
Creatinine, Ser: 0.91 mg/dL (ref 0.61–1.24)
GFR calc Af Amer: >60 mL/min (ref >60)
GFR calc non Af Amer: >60 mL/min (ref >60)

## 2019-04-20 LAB — POCT ACTIVATED CLOTTING TIME: Activated Clotting Time: 279 seconds

## 2019-04-20 SURGERY — TRANSCAROTID ARTERY REVASCULARIZATION (TCAR)
Anesthesia: General | Site: Neck | Laterality: Right

## 2019-04-20 MED ORDER — SODIUM CHLORIDE 0.9 % IV SOLN
INTRAVENOUS | Status: DC
  Administered 2019-04-20: 11:00:00 via INTRAVENOUS

## 2019-04-20 MED ORDER — ONDANSETRON HCL 4 MG/2ML IJ SOLN
INTRAMUSCULAR | Status: AC
  Filled 2019-04-20: qty 2

## 2019-04-20 MED ORDER — ATORVASTATIN CALCIUM 10 MG PO TABS
20.0000 mg | ORAL_TABLET | Freq: Every day | ORAL | Status: DC
  Administered 2019-04-21: 20 mg via ORAL
  Filled 2019-04-20: qty 2

## 2019-04-20 MED ORDER — DEXAMETHASONE SODIUM PHOSPHATE 10 MG/ML IJ SOLN
INTRAMUSCULAR | Status: AC
  Filled 2019-04-20: qty 1

## 2019-04-20 MED ORDER — GUAIFENESIN-DM 100-10 MG/5ML PO SYRP: 15.0000 mL | ORAL_SOLUTION | ORAL | Status: DC | PRN

## 2019-04-20 MED ORDER — SODIUM CHLORIDE 0.9 % IV SOLN: INTRAVENOUS | Status: DC

## 2019-04-20 MED ORDER — 0.9 % SODIUM CHLORIDE (POUR BTL) OPTIME
TOPICAL | Status: DC | PRN
  Administered 2019-04-20: 1000 mL

## 2019-04-20 MED ORDER — CEFAZOLIN SODIUM-DEXTROSE 2-4 GM/100ML-% IV SOLN
2.0000 g | Freq: Three times a day (TID) | INTRAVENOUS | Status: AC
  Administered 2019-04-20 (×2): 2 g via INTRAVENOUS
  Filled 2019-04-20 (×2): qty 100

## 2019-04-20 MED ORDER — PROTAMINE SULFATE 10 MG/ML IV SOLN
INTRAVENOUS | Status: DC | PRN
  Administered 2019-04-20: 50 mg via INTRAVENOUS

## 2019-04-20 MED ORDER — SODIUM CHLORIDE 0.9 % IV SOLN
INTRAVENOUS | Status: DC | PRN
  Administered 2019-04-20: 500 mL

## 2019-04-20 MED ORDER — ALUM & MAG HYDROXIDE-SIMETH 200-200-20 MG/5ML PO SUSP: 15.0000 mL | ORAL | Status: DC | PRN

## 2019-04-20 MED ORDER — CLOPIDOGREL BISULFATE 75 MG PO TABS
75.0000 mg | ORAL_TABLET | Freq: Every day | ORAL | Status: DC
  Administered 2019-04-21: 75 mg via ORAL
  Filled 2019-04-20: qty 1

## 2019-04-20 MED ORDER — HEPARIN SODIUM (PORCINE) 1000 UNIT/ML IJ SOLN
INTRAMUSCULAR | Status: DC | PRN
  Administered 2019-04-20: 9000 [IU] via INTRAVENOUS

## 2019-04-20 MED ORDER — LIDOCAINE 2% (20 MG/ML) 5 ML SYRINGE
INTRAMUSCULAR | Status: DC | PRN
  Administered 2019-04-20: 60 mg via INTRAVENOUS

## 2019-04-20 MED ORDER — ROCURONIUM BROMIDE 100 MG/10ML IV SOLN
INTRAVENOUS | Status: DC | PRN
  Administered 2019-04-20: 10 mg via INTRAVENOUS
  Administered 2019-04-20: 50 mg via INTRAVENOUS
  Administered 2019-04-20: 10 mg via INTRAVENOUS

## 2019-04-20 MED ORDER — OXYCODONE-ACETAMINOPHEN 5-325 MG PO TABS: 1.0000 | ORAL_TABLET | ORAL | Status: DC | PRN

## 2019-04-20 MED ORDER — ONDANSETRON HCL 4 MG/2ML IJ SOLN: 4.0000 mg | Freq: Four times a day (QID) | INTRAMUSCULAR | Status: DC | PRN

## 2019-04-20 MED ORDER — LACTATED RINGERS IV SOLN
INTRAVENOUS | Status: DC | PRN
  Administered 2019-04-20: 07:00:00 via INTRAVENOUS

## 2019-04-20 MED ORDER — HYDROMORPHONE HCL 1 MG/ML IJ SOLN: 0.5000 mg | INTRAMUSCULAR | Status: DC | PRN

## 2019-04-20 MED ORDER — CHLORHEXIDINE GLUCONATE CLOTH 2 % EX PADS: 6.0000 | MEDICATED_PAD | Freq: Once | CUTANEOUS | Status: DC

## 2019-04-20 MED ORDER — PHENYLEPHRINE 40 MCG/ML (10ML) SYRINGE FOR IV PUSH (FOR BLOOD PRESSURE SUPPORT)
PREFILLED_SYRINGE | INTRAVENOUS | Status: DC | PRN
  Administered 2019-04-20 (×2): 80 ug via INTRAVENOUS

## 2019-04-20 MED ORDER — PHENOL 1.4 % MT LIQD: 1.0000 | OROMUCOSAL | Status: DC | PRN

## 2019-04-20 MED ORDER — ACETAMINOPHEN 325 MG PO TABS: 325.0000 mg | ORAL_TABLET | ORAL | Status: DC | PRN

## 2019-04-20 MED ORDER — SODIUM CHLORIDE 0.9 % IV SOLN: 500.0000 mL | Freq: Once | INTRAVENOUS | Status: DC | PRN

## 2019-04-20 MED ORDER — ONDANSETRON HCL 4 MG/2ML IJ SOLN
INTRAMUSCULAR | Status: DC | PRN
  Administered 2019-04-20 (×2): 4 mg via INTRAVENOUS

## 2019-04-20 MED ORDER — PANTOPRAZOLE SODIUM 40 MG PO TBEC
40.0000 mg | DELAYED_RELEASE_TABLET | Freq: Every day | ORAL | Status: DC
  Administered 2019-04-20 – 2019-04-21 (×2): 40 mg via ORAL
  Filled 2019-04-20 (×2): qty 1

## 2019-04-20 MED ORDER — FENTANYL CITRATE (PF) 100 MCG/2ML IJ SOLN
INTRAMUSCULAR | Status: DC | PRN
  Administered 2019-04-20: 50 ug via INTRAVENOUS
  Administered 2019-04-20: 100 ug via INTRAVENOUS

## 2019-04-20 MED ORDER — SENNOSIDES-DOCUSATE SODIUM 8.6-50 MG PO TABS: 1.0000 | ORAL_TABLET | Freq: Every evening | ORAL | Status: DC | PRN

## 2019-04-20 MED ORDER — SODIUM CHLORIDE 0.9 % IV SOLN
INTRAVENOUS | Status: DC | PRN
  Administered 2019-04-20: 50 ug/min via INTRAVENOUS

## 2019-04-20 MED ORDER — ENOXAPARIN SODIUM 40 MG/0.4ML ~~LOC~~ SOLN
40.0000 mg | SUBCUTANEOUS | Status: DC
  Administered 2019-04-21: 40 mg via SUBCUTANEOUS
  Filled 2019-04-20: qty 0.4

## 2019-04-20 MED ORDER — ACETAMINOPHEN 500 MG PO TABS
1000.0000 mg | ORAL_TABLET | Freq: Once | ORAL | Status: AC
  Administered 2019-04-20: 1000 mg via ORAL
  Filled 2019-04-20: qty 2

## 2019-04-20 MED ORDER — LIDOCAINE 2% (20 MG/ML) 5 ML SYRINGE
INTRAMUSCULAR | Status: AC
  Filled 2019-04-20: qty 5

## 2019-04-20 MED ORDER — FENTANYL CITRATE (PF) 250 MCG/5ML IJ SOLN
INTRAMUSCULAR | Status: AC
  Filled 2019-04-20: qty 5

## 2019-04-20 MED ORDER — ACETAMINOPHEN 325 MG RE SUPP: 325.0000 mg | RECTAL | Status: DC | PRN

## 2019-04-20 MED ORDER — CEFAZOLIN SODIUM-DEXTROSE 2-4 GM/100ML-% IV SOLN
INTRAVENOUS | Status: AC
  Filled 2019-04-20: qty 100

## 2019-04-20 MED ORDER — AMLODIPINE BESYLATE 5 MG PO TABS
5.0000 mg | ORAL_TABLET | Freq: Every day | ORAL | Status: DC
  Administered 2019-04-21: 5 mg via ORAL
  Filled 2019-04-20: qty 1

## 2019-04-20 MED ORDER — BISACODYL 5 MG PO TBEC: 5.0000 mg | DELAYED_RELEASE_TABLET | Freq: Every day | ORAL | Status: DC | PRN

## 2019-04-20 MED ORDER — PROPOFOL 10 MG/ML IV BOLUS
INTRAVENOUS | Status: DC | PRN
  Administered 2019-04-20: 50 mg via INTRAVENOUS
  Administered 2019-04-20: 150 mg via INTRAVENOUS

## 2019-04-20 MED ORDER — HYDROMORPHONE HCL 1 MG/ML IJ SOLN: 0.2500 mg | INTRAMUSCULAR | Status: DC | PRN

## 2019-04-20 MED ORDER — MAGNESIUM SULFATE 2 GM/50ML IV SOLN: 2.0000 g | Freq: Every day | INTRAVENOUS | Status: DC | PRN

## 2019-04-20 MED ORDER — LABETALOL HCL 5 MG/ML IV SOLN
10.0000 mg | INTRAVENOUS | Status: DC | PRN
  Filled 2019-04-20: qty 4

## 2019-04-20 MED ORDER — SODIUM CHLORIDE 0.9 % IV SOLN
INTRAVENOUS | Status: AC
  Filled 2019-04-20: qty 1.2

## 2019-04-20 MED ORDER — PROPOFOL 10 MG/ML IV BOLUS
INTRAVENOUS | Status: AC
  Filled 2019-04-20: qty 20

## 2019-04-20 MED ORDER — EPHEDRINE SULFATE-NACL 50-0.9 MG/10ML-% IV SOSY
PREFILLED_SYRINGE | INTRAVENOUS | Status: DC | PRN
  Administered 2019-04-20 (×3): 5 mg via INTRAVENOUS

## 2019-04-20 MED ORDER — DEXAMETHASONE SODIUM PHOSPHATE 10 MG/ML IJ SOLN
INTRAMUSCULAR | Status: DC | PRN
  Administered 2019-04-20: 10 mg via INTRAVENOUS

## 2019-04-20 MED ORDER — LEVOTHYROXINE SODIUM 25 MCG PO TABS
25.0000 ug | ORAL_TABLET | Freq: Every day | ORAL | Status: DC
  Administered 2019-04-21: 25 ug via ORAL
  Filled 2019-04-20: qty 1

## 2019-04-20 MED ORDER — POTASSIUM CHLORIDE CRYS ER 20 MEQ PO TBCR: 20.0000 meq | EXTENDED_RELEASE_TABLET | Freq: Every day | ORAL | Status: DC | PRN

## 2019-04-20 MED ORDER — IODIXANOL 320 MG/ML IV SOLN
INTRAVENOUS | Status: DC | PRN
  Administered 2019-04-20: 08:00:00 25 mL via INTRAVENOUS

## 2019-04-20 MED ORDER — SCOPOLAMINE 1 MG/3DAYS TD PT72
MEDICATED_PATCH | TRANSDERMAL | Status: AC
  Filled 2019-04-20: qty 1

## 2019-04-20 MED ORDER — GLYCOPYRROLATE PF 0.2 MG/ML IJ SOSY
PREFILLED_SYRINGE | INTRAMUSCULAR | Status: DC | PRN
  Administered 2019-04-20 (×2): .1 mg via INTRAVENOUS

## 2019-04-20 MED ORDER — ASPIRIN EC 81 MG PO TBEC
81.0000 mg | DELAYED_RELEASE_TABLET | Freq: Every day | ORAL | Status: DC
  Administered 2019-04-21: 81 mg via ORAL
  Filled 2019-04-20: qty 1

## 2019-04-20 MED ORDER — DOCUSATE SODIUM 100 MG PO CAPS
100.0000 mg | ORAL_CAPSULE | Freq: Every day | ORAL | Status: DC
  Filled 2019-04-20: qty 1

## 2019-04-20 MED ORDER — SUCCINYLCHOLINE CHLORIDE 200 MG/10ML IV SOSY
PREFILLED_SYRINGE | INTRAVENOUS | Status: DC | PRN
  Administered 2019-04-20: 100 mg via INTRAVENOUS

## 2019-04-20 MED ORDER — HYDRALAZINE HCL 20 MG/ML IJ SOLN: 5.0000 mg | INTRAMUSCULAR | Status: DC | PRN

## 2019-04-20 MED ORDER — ASPIRIN 81 MG PO TBEC: 81.0000 mg | DELAYED_RELEASE_TABLET | Freq: Every day | ORAL | Status: DC

## 2019-04-20 MED ORDER — METOPROLOL TARTRATE 5 MG/5ML IV SOLN: 2.0000 mg | INTRAVENOUS | Status: DC | PRN

## 2019-04-20 MED ORDER — SCOPOLAMINE 1 MG/3DAYS TD PT72
MEDICATED_PATCH | TRANSDERMAL | Status: DC | PRN
  Administered 2019-04-20: 1 via TRANSDERMAL

## 2019-04-20 MED ORDER — SUGAMMADEX SODIUM 200 MG/2ML IV SOLN
INTRAVENOUS | Status: DC | PRN
  Administered 2019-04-20: 200 mg via INTRAVENOUS

## 2019-04-20 MED ORDER — ROCURONIUM BROMIDE 10 MG/ML (PF) SYRINGE
PREFILLED_SYRINGE | INTRAVENOUS | Status: AC
  Filled 2019-04-20: qty 10

## 2019-04-20 MED ORDER — CEFAZOLIN SODIUM-DEXTROSE 2-4 GM/100ML-% IV SOLN
2.0000 g | INTRAVENOUS | Status: AC
  Administered 2019-04-20: 2 g via INTRAVENOUS

## 2019-04-20 SURGICAL SUPPLY — 69 items
BAG BANDED W/RUBBER/TAPE 36X54 (MISCELLANEOUS) ×2 IMPLANT
BALLN STERLING RX (BALLOONS) ×2
BALLN STERLING RX 5X30X80 (BALLOONS) ×2
BALLOON STERLING RX (BALLOONS) IMPLANT
BALLOON STERLING RX 5X30X80 (BALLOONS) IMPLANT
CANISTER SUCT 3000ML PPV (MISCELLANEOUS) ×2 IMPLANT
CANNULA VESSEL 3MM 2 BLNT TIP (CANNULA) ×2 IMPLANT
CATH ROBINSON RED A/P 18FR (CATHETERS) ×1 IMPLANT
CLIP VESOCCLUDE MED 6/CT (CLIP) ×2 IMPLANT
CLIP VESOCCLUDE SM WIDE 6/CT (CLIP) ×2 IMPLANT
COVER DOME SNAP 22 D (MISCELLANEOUS) ×2 IMPLANT
COVER PROBE W GEL 5X96 (DRAPES) ×2 IMPLANT
COVER WAND RF STERILE (DRAPES) ×1 IMPLANT
DECANTER SPIKE VIAL GLASS SM (MISCELLANEOUS) IMPLANT
DERMABOND ADVANCED (GAUZE/BANDAGES/DRESSINGS) ×2
DERMABOND ADVANCED .7 DNX12 (GAUZE/BANDAGES/DRESSINGS) ×1 IMPLANT
DRAIN HEMOVAC 1/8 X 5 (WOUND CARE) IMPLANT
DRAPE INCISE IOBAN 66X45 STRL (DRAPES) ×4 IMPLANT
ELECT REM PT RETURN 9FT ADLT (ELECTROSURGICAL) ×2
ELECTRODE REM PT RTRN 9FT ADLT (ELECTROSURGICAL) ×1 IMPLANT
EVACUATOR SILICONE 100CC (DRAIN) IMPLANT
GLOVE BIO SURGEON STRL SZ7.5 (GLOVE) ×3 IMPLANT
GOWN STRL REUS W/ TWL LRG LVL3 (GOWN DISPOSABLE) ×3 IMPLANT
GOWN STRL REUS W/TWL LRG LVL3 (GOWN DISPOSABLE) ×3
GUIDEWIRE ENROUTE 0.014 (WIRE) ×1 IMPLANT
HEMOSTAT SPONGE AVITENE ULTRA (HEMOSTASIS) IMPLANT
INTRODUCER KIT GALT 7CM (INTRODUCER) ×1
KIT BASIN OR (CUSTOM PROCEDURE TRAY) ×2 IMPLANT
KIT ENCORE 26 ADVANTAGE (KITS) ×1 IMPLANT
KIT INTRODUCER GALT 7 (INTRODUCER) ×1 IMPLANT
KIT TURNOVER KIT B (KITS) ×2 IMPLANT
NDL HYPO 25GX1X1/2 BEV (NEEDLE) IMPLANT
NDL PERC 18GX7CM (NEEDLE) ×1 IMPLANT
NEEDLE HYPO 25GX1X1/2 BEV (NEEDLE) IMPLANT
NEEDLE PERC 18GX7CM (NEEDLE) ×2 IMPLANT
NS IRRIG 1000ML POUR BTL (IV SOLUTION) ×4 IMPLANT
PACK CAROTID (CUSTOM PROCEDURE TRAY) ×2 IMPLANT
PACK UNIVERSAL I (CUSTOM PROCEDURE TRAY) ×1 IMPLANT
PAD ARMBOARD 7.5X6 YLW CONV (MISCELLANEOUS) ×4 IMPLANT
POSITIONER HEAD DONUT 9IN (MISCELLANEOUS) ×2 IMPLANT
PROTECTION STATION PRESSURIZED (MISCELLANEOUS) ×2
SET MICROPUNCTURE 5F STIFF (MISCELLANEOUS) ×2 IMPLANT
SHEATH AVANTI 11CM 5FR (SHEATH) ×1 IMPLANT
SHUNT CAROTID BYPASS 10 (VASCULAR PRODUCTS) IMPLANT
SHUNT CAROTID BYPASS 12FRX15.5 (VASCULAR PRODUCTS) IMPLANT
STATION PROTECTION PRESSURIZED (MISCELLANEOUS) ×1 IMPLANT
STENT TRANSCAROTID SYS 7X40 (Permanent Stent) ×1 IMPLANT
STOPCOCK MORSE 400PSI 3WAY (MISCELLANEOUS) ×2 IMPLANT
SUT ETHILON 3 0 PS 1 (SUTURE) IMPLANT
SUT PROLENE 6 0 CC (SUTURE) ×3 IMPLANT
SUT SILK 2 0 PERMA HAND 18 BK (SUTURE) ×2 IMPLANT
SUT SILK 2 0SH CR/8 30 (SUTURE) ×2 IMPLANT
SUT SILK 3 0 TIES 17X18 (SUTURE)
SUT SILK 3-0 18XBRD TIE BLK (SUTURE) IMPLANT
SUT VIC AB 3-0 SH 27 (SUTURE) ×1
SUT VIC AB 3-0 SH 27X BRD (SUTURE) ×1 IMPLANT
SUT VICRYL 4-0 PS2 18IN ABS (SUTURE) ×2 IMPLANT
SYR 10ML LL (SYRINGE) ×6 IMPLANT
SYR 20ML LL LF (SYRINGE) ×2 IMPLANT
SYR 5ML LL (SYRINGE) ×2 IMPLANT
SYR BULB IRRIGATION 50ML (SYRINGE) ×1 IMPLANT
SYR CONTROL 10ML LL (SYRINGE) IMPLANT
SYSTEM TRANSCAROTID NEUROPRTCT (MISCELLANEOUS) IMPLANT
TOWEL GREEN STERILE (TOWEL DISPOSABLE) ×2 IMPLANT
TRANSCAROTID NEUROPROTECT SYS (MISCELLANEOUS) ×2
TUBING EXTENTION W/L.L. (IV SETS) ×1 IMPLANT
WATER STERILE IRR 1000ML POUR (IV SOLUTION) ×2 IMPLANT
WIRE AMPLATZ SS-J .035X180CM (WIRE) IMPLANT
WIRE BENTSON .035X145CM (WIRE) ×2 IMPLANT

## 2019-04-20 NOTE — Progress Notes (Signed)
Per CCMD, patient had a 6 beat run of Vtach. Patient sitting in chair with no s/s of distress. Dr. Carlis Abbott notified.   Emelda Fear, RN

## 2019-04-20 NOTE — Plan of Care (Signed)
Continue to monitor

## 2019-04-20 NOTE — Anesthesia Postprocedure Evaluation (Signed)
Anesthesia Post Note  Patient: TEQUAN SEAVERS  Procedure(s) Performed: Dorthula Perfect ARTERY REVASCULARIZATION (Right Neck)     Patient location during evaluation: PACU Anesthesia Type: General Level of consciousness: awake and alert Pain management: pain level controlled Vital Signs Assessment: post-procedure vital signs reviewed and stable Respiratory status: spontaneous breathing, nonlabored ventilation and respiratory function stable Cardiovascular status: blood pressure returned to baseline and stable Postop Assessment: no apparent nausea or vomiting Anesthetic complications: no    Last Vitals:  Vitals:   04/20/19 0955 04/20/19 1010  BP: 117/69 106/74  Pulse: 69 70  Resp: 12 15  Temp:  36.6 C  SpO2: 100% 98%    Last Pain:  Vitals:   04/20/19 1010  TempSrc:   PainSc: 0-No pain    LLE Motor Response: Purposeful movement;Responds to commands (04/20/19 1010) LLE Sensation: Full sensation (04/20/19 1010) RLE Motor Response: Purposeful movement;Responds to commands (04/20/19 1010) RLE Sensation: Full sensation (04/20/19 1010)      Buelah Rennie,W. EDMOND

## 2019-04-20 NOTE — Anesthesia Procedure Notes (Signed)
Arterial Line Insertion Start/End8/31/2020 7:00 AM Performed by: Janene Harvey, CRNA, CRNA  Patient location: Pre-op. Preanesthetic checklist: patient identified, IV checked, site marked, risks and benefits discussed, surgical consent, monitors and equipment checked, pre-op evaluation, timeout performed and anesthesia consent Lidocaine 1% used for infiltration Right, radial was placed Catheter size: 20 G Hand hygiene performed  and maximum sterile barriers used   Attempts: 1 Procedure performed without using ultrasound guided technique. Following insertion, dressing applied and Biopatch. Post procedure assessment: normal and unchanged  Patient tolerated the procedure well with no immediate complications.

## 2019-04-20 NOTE — Interval H&P Note (Signed)
History and Physical Interval Note:  04/20/2019 7:16 AM  Terry Holt  has presented today for surgery, with the diagnosis of right carotid stenosis.  The various methods of treatment have been discussed with the patient and family. After consideration of risks, benefits and other options for treatment, the patient has consented to  Procedure(s): TRANSCAROTID ARTERY REVASCULARIZATION (Right) as a surgical intervention.  The patient's history has been reviewed, patient examined, no change in status, stable for surgery.  I have reviewed the patient's chart and labs.  Questions were answered to the patient's satisfaction.     Ruta Hinds

## 2019-04-20 NOTE — Transfer of Care (Signed)
Immediate Anesthesia Transfer of Care Note  Patient: Terry Holt  Procedure(s) Performed: Dorthula Perfect ARTERY REVASCULARIZATION (Right Neck)  Patient Location: PACU  Anesthesia Type:General  Level of Consciousness: awake and patient cooperative  Airway & Oxygen Therapy: Patient Spontanous Breathing and Patient connected to nasal cannula oxygen  Post-op Assessment: Report given to RN, Post -op Vital signs reviewed and stable and Patient moving all extremities X 4  Post vital signs: Reviewed and stable  Last Vitals:  Vitals Value Taken Time  BP 124/70 04/20/19 0939  Temp    Pulse 80 04/20/19 0940  Resp 15 04/20/19 0940  SpO2 100 % 04/20/19 0940  Vitals shown include unvalidated device data.  Last Pain:  Vitals:   04/20/19 0604  TempSrc:   PainSc: 0-No pain      Patients Stated Pain Goal: 2 ([id number])  Complications: No apparent anesthesia complications

## 2019-04-20 NOTE — Progress Notes (Signed)
Patient arrived from PACU to 4east 13 patient placed on monitor and vital signs obtained. CCMD made aware. Patient oriented to room. Will monitor patient. Ival Basquez, Bettina Gavia RN

## 2019-04-20 NOTE — Anesthesia Procedure Notes (Signed)
Procedure Name: Intubation Date/Time: 04/20/2019 7:42 AM Performed by: Janene Harvey, CRNA Pre-anesthesia Checklist: Patient identified, Emergency Drugs available, Suction available and Patient being monitored Patient Re-evaluated:Patient Re-evaluated prior to induction Oxygen Delivery Method: Circle system utilized Preoxygenation: Pre-oxygenation with 100% oxygen Induction Type: IV induction Laryngoscope Size: Mac and 4 Grade View: Grade II Tube type: Oral Tube size: 7.5 mm Number of attempts: 1 Airway Equipment and Method: Stylet and Oral airway Placement Confirmation: ETT inserted through vocal cords under direct vision,  positive ETCO2 and breath sounds checked- equal and bilateral Secured at: 22 cm Tube secured with: Tape Dental Injury: Teeth and Oropharynx as per pre-operative assessment

## 2019-04-20 NOTE — Op Note (Signed)
Procedure: Right Trans carotid artery revascularization (TCAR)  Preoperative diagnosis: asymptomatic right internal carotid artery stenosis 80%  Postoperative diagnosis: Same  Anesthesia: Gen.  Assistant: Gae Gallop M.D.  Indications: Patient is an 63 year old who previously had radiation to the right side of his neck and now has an asymptomatic greater than 80% right internal carotid artery stenosis.  Operative findings:   #1 85% right internal carotid artery stenosis stented to residual 0% stenosis (7 x 40 mm ENROUTE stent)  #2  Left femoral venous access  #3 15-minute flow reversal time  #4  Lesion length 35 mm  Operative details: After obtaining informed consent, the patient was taken the operating room. The patient was placed in supine position upper table. After induction of general anesthesia and endotracheal intubation.  At this point the entire right neck and chest were prepped and draped in usual sterile fashion. The left groin was also prepped and draped in usual sterile fashion. A time out was performed. Next a oblique incision was made between the heads of the sternocleidomastoid muscle on the right side of the neck. Incision was carried down through the platysma to the level of the right internal jugular vein. This was reflected laterally. The vagus nerve was identified and protected. Common carotid artery was dissected free circumferentially at the base of the incision. This was elevated up in the operative field with an umbilical tape. Approximately 3-4 cm of the artery was dissected free circumferentially to allow adequate exposure. A pursestring suture was placed in the artery at the area we were planning to puncture using a running 6-0 Prolene suture.At this point femoral venous access was established via the left groin. Ultrasound was used to identify the left common femoral vein. A micropuncture needle was used to cannulate the left common femoral vein and a  micropuncture wire advanced into the vein and the micropuncture sheath placed over this. An 3 Bentsen wire was advanced up into the left femoral venous system. The sheath for the flow reversal system was placed into the left femoral system and thoroughly flushed with heparinized saline. The patient was given 8,000 units of total heparin to establish an ACT greater than 275. At this point a micropuncture needle was used to cannulate the right common carotid artery. The micropuncture wire was advanced just below the level of the carotid bifurcation about 3.5 cm. The micropuncture sheath was then advanced over this about 3.5 cm cm into the common carotid artery. This was thoroughly flushed with heparinized saline.a contrast angiogram was then performed in AP projection to confirm that we were truly intraluminal with the sheath. This was also used to determine the level of the carotid bifurcation. The carotid Amplatz wire was then placed through the micropuncture sheath and the sheath removed. The 8 French flow reversal arterial sheath was then advanced over the guidewire and into the common carotid artery. Sheath was sutured to the skin with 3 2-0 silk sutures.Contrast angiogram was again performed to make sure that we were within the true lumen. This was done in 2 views.  The filter device was switched to the low-flow selection. The flow reversal system was then hooked up to the arterial end of the sheath after everything had been thoroughly flushed and de-aired. Passive flow was used to fill the arterial and of the filter and through the filter and up to the level of the venous sheath. The venous sheath was also fully de-aired and passive flow was established. This was checked by saline infusion  in the venous port and noted to flush easily. High flow was then turned on on the filter device. At this point a TCAR timeout was performed to make sure that the patient's blood pressure was reasonable. It was systolic  259 at this point. We again confirmed that the ACT was above 250. The balloon and guidewire insufflator and stent were all selected. These were all prepped. A 3 x 30 mm angioplasty balloon had been selected based on the preoperative CT. Based on the angiogram intraoperatively as well as preoperative CT and 7 x 40 mm Enroute stent was selected. The 014 wire was slightly shaped opening and due to slight curvature of the internal carotid artery. This was advanced with the balloon over it into the common carotid artery and a guidewire used to selectively catheterize the left internal carotid artery. The lesion was moderately calcified .We confirmed that we were within the internal carotid artery by first establishing that the guidewire advanced into the petrous portion of the internal carotid artery and also with contrast angiogram. At this point the 3 x 30 balloon was centered on the lesion and inflated to 8 atm slowly inflating and deflating the balloon. The patient had been given a dose of glycopyrrolate as well as atropine and an additional dose of glycopyrrolate to establish and maintain her blood pressure and heart rate. After predilatation a contrast angiogram was again performed which showed some improvement in the stenosis. We then brought up the 7 x 40 Enroute stent and advanced and centered this on the lesion.  This was then deployed using a pinch technique after opening the Tuohy Borst valve.  The lesion was then postdilated with a 5 x 30 balloon with an up-and-down inflation to 8 atm.  We then gave the patient 2 minutes of additional flow reversal before performing a completion angiogram.  Completion angiogram showed no residual waist 0% stenosis no evidence of dissection in the common carotid or distal internal carotid with good flow into the cerebral circulation.  At this point the guidewire was removed. The flow reversal system was clamped off at the arterial sheath and disconnected. The remaining blood  was returned to the patient. This was then disconnected from the venous sheath. The arterial sheath was removed and the pursestring suture cinched down. The patient was given 50 mg of protamine to achieve hemostasis. The venous sheath was pulled and hemostasis obtained with direct pressure. The carotid was inspected found to be without any area of hematoma or active bleeding. Pt was given 50 mg of protamine. The platysma muscle was reapproximated using a running 3-0 Vicryl suture. The skin was closed with a 4-0 Vicryl subcuticular stitch. Dermabond was applied to the incision. Patient was awakened in the operating room and moving her upper and lower extremities symmetrically. She was taken to the recovery room in stable condition.  Ruta Hinds, MD [facility] Office: [phone] Pager: [phone]

## 2019-04-20 NOTE — Discharge Instructions (Signed)
   [facility]  Discharge Instructions   Carotid Endarterectomy (CEA)  Please refer to the following instructions for your post-procedure care. Your surgeon or physician assistant will discuss any changes with you.  Activity  You are encouraged to walk as much as you can. You can slowly return to normal activities but must avoid strenuous activity and heavy lifting until your doctor tell you it's OK. Avoid activities such as vacuuming or swinging a golf club. You can drive after one week if you are comfortable and you are no longer taking prescription pain medications. It is normal to feel tired for serval weeks after your surgery. It is also normal to have difficulty with sleep habits, eating, and bowel movements after surgery. These will go away with time.  Bathing/Showering  You may shower after you come home. Do not soak in a bathtub, hot tub, or swim until the incision heals completely.  Incision Care  Shower every day. Clean your incision with mild soap and water. Pat the area dry with a clean towel. You do not need a bandage unless otherwise instructed. Do not apply any ointments or creams to your incision. You may have skin glue on your incision. Do not peel it off. It will come off on its own in about one week. Your incision may feel thickened and raised for several weeks after your surgery. This is normal and the skin will soften over time. For Men Only: It's OK to shave around the incision but do not shave the incision itself for 2 weeks. It is common to have numbness under your chin that could last for several months.  Diet  Resume your normal diet. There are no special food restrictions following this procedure. A low fat/low cholesterol diet is recommended for all patients with vascular disease. In order to heal from your surgery, it is CRITICAL to get adequate nutrition. Your body requires vitamins, minerals, and protein. Vegetables are the best  source of vitamins and minerals. Vegetables also provide the perfect balance of protein. Processed food has little nutritional value, so try to avoid this.        Medications  Resume taking all of your medications unless your doctor or physician assistant tells you not to. If your incision is causing pain, you may take over-the- counter pain relievers such as acetaminophen (Tylenol). If you were prescribed a stronger pain medication, please be aware these medications can cause nausea and constipation. Prevent nausea by taking the medication with a snack or meal. Avoid constipation by drinking plenty of fluids and eating foods with a high amount of fiber, such as fruits, vegetables, and grains. Do not take Tylenol if you are taking prescription pain medications.  Follow Up  Our office will schedule a follow up appointment 2-3 weeks following discharge.  Please call us immediately for any of the following conditions  Increased pain, redness, drainage (pus) from your incision site. Fever of 101 degrees or higher. If you should develop stroke (slurred speech, difficulty swallowing, weakness on one side of your body, loss of vision) you should call 911 and go to the nearest emergency room.  Reduce your risk of vascular disease:  Stop smoking. If you would like help call [facility] at 1-800-QUIT-NOW (1-800-784-8669) or [place] at 336-586-4000. Manage your cholesterol Maintain a desired weight Control your diabetes Keep your blood pressure down  If you have any questions, please call the office at [phone].   

## 2019-04-21 ENCOUNTER — Encounter (HOSPITAL_COMMUNITY): Payer: Self-pay | Admitting: Vascular Surgery

## 2019-04-21 ENCOUNTER — Telehealth: Payer: Self-pay | Admitting: Vascular Surgery

## 2019-04-21 LAB — BASIC METABOLIC PANEL
Anion gap: 8 (ref 5–15)
BUN: 10 mg/dL (ref 8–23)
CO2: 24 mmol/L (ref 22–32)
Calcium: 8.6 mg/dL — ABNORMAL LOW (ref 8.9–10.3)
Chloride: 105 mmol/L (ref 98–111)
Creatinine, Ser: 0.83 mg/dL (ref 0.61–1.24)
GFR calc Af Amer: >60 mL/min (ref >60)
GFR calc non Af Amer: >60 mL/min (ref >60)
Glucose, Bld: 101 mg/dL — ABNORMAL HIGH (ref 70–99)
Potassium: 3.7 mmol/L (ref 3.5–5.1)
Sodium: 137 mmol/L (ref 135–145)

## 2019-04-21 LAB — CBC
HCT: 35.1 % — ABNORMAL LOW (ref 39.0–52.0)
Hemoglobin: 12.4 g/dL — ABNORMAL LOW (ref 13.0–17.0)
MCH: 34.1 pg — ABNORMAL HIGH (ref 26.0–34.0)
MCHC: 35.3 g/dL (ref 30.0–36.0)
MCV: 96.4 fL (ref 80.0–100.0)
Platelets: 157 10*3/uL (ref 150–400)
RBC: 3.64 MIL/uL — ABNORMAL LOW (ref 4.22–5.81)
RDW: 13 % (ref 11.5–15.5)
WBC: 10.8 10*3/uL — ABNORMAL HIGH (ref 4.0–10.5)
nRBC: 0 % (ref 0.0–0.2)

## 2019-04-21 MED ORDER — OXYCODONE-ACETAMINOPHEN 5-325 MG PO TABS: 1.0000 | ORAL_TABLET | Freq: Four times a day (QID) | ORAL | 0 refills | Status: DC | PRN
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
## 2019-04-21 NOTE — Progress Notes (Signed)
  Progress Note    04/21/2019 8:01 AM 1 Day Post-Op  Subjective:  Denies stroke like symptoms including slurring speech, changes in vision or one sided weakness   Vitals:   04/21/19 0500 04/21/19 0800  BP: 105/61 104/65  Pulse: (!) 55 64  Resp: (!) 22 (!) 22  Temp:  98.1 F (36.7 C)  SpO2: 100% 100%   Physical Exam: Lungs:  Non labored Incisions:  R neck incision c/d/i; L groin vein cath site without firm hematoma Extremities:  Moving all extremities well Abdomen:  Soft Neurologic: A&O; CN grossly intact  CBC    Component Value Date/Time   WBC 10.8 (H) 04/21/2019 0525   RBC 3.64 (L) 04/21/2019 0525   HGB 12.4 (L) 04/21/2019 0525   HCT 35.1 (L) 04/21/2019 0525   PLT 157 04/21/2019 0525   MCV 96.4 04/21/2019 0525   MCH 34.1 (H) 04/21/2019 0525   MCHC 35.3 04/21/2019 0525   RDW 13.0 04/21/2019 0525   LYMPHSABS 1.0 04/09/2019 1036   MONOABS 0.7 04/09/2019 1036   EOSABS 0.1 04/09/2019 1036   BASOSABS 0.0 04/09/2019 1036    BMET    Component Value Date/Time   NA 137 04/21/2019 0525   K 3.7 04/21/2019 0525   CL 105 04/21/2019 0525   CO2 24 04/21/2019 0525   GLUCOSE 101 (H) 04/21/2019 0525   BUN 10 04/21/2019 0525   CREATININE 0.83 04/21/2019 0525   CALCIUM 8.6 (L) 04/21/2019 0525   GFRNONAA >60 04/21/2019 0525   GFRAA >60 04/21/2019 0525    INR    Component Value Date/Time   INR 1.1 04/16/2019 0849     Intake/Output Summary (Last 24 hours) at 04/21/2019 0801 Last data filed at 04/21/2019 0459 Gross per 24 hour  Intake 2200 ml  Output 1500 ml  Net 700 ml     Assessment/Plan:  63 y.o. male is s/p R TCAR 1 Day Post-Op   Neuro exam remains at baseline Incision unremarkable Ok for discharge this morning Follow up in 3-4 weeks with carotid duplex   Dagoberto Ligas, PA-C [facility] [phone] 04/21/2019 8:01 AM

## 2019-04-21 NOTE — Progress Notes (Signed)
D/C instructions given to pt. Wound care and medications reviewed. All questions answered. 2 IV removed, clean and intact. Wife to escort pt home.  Clyde Canterbury, RN

## 2019-04-21 NOTE — Progress Notes (Signed)
Pt ambulated in hallway independently 400 ft. Tolerated well.  Clyde Canterbury, RN

## 2019-04-21 NOTE — Telephone Encounter (Signed)
Left Message of upcoming appt on 05/21/19 @ 8:00 am ultra sound and 8:40 am with [name] mailed reminder letter and paperwork

## 2019-04-22 ENCOUNTER — Encounter (HOSPITAL_COMMUNITY): Payer: Self-pay | Admitting: Vascular Surgery

## 2019-05-19 ENCOUNTER — Other Ambulatory Visit: Payer: Self-pay

## 2019-05-19 DIAGNOSIS — I6521 Occlusion and stenosis of right carotid artery: Secondary | ICD-10-CM

## 2019-05-21 ENCOUNTER — Ambulatory Visit (INDEPENDENT_AMBULATORY_CARE_PROVIDER_SITE_OTHER): Payer: Self-pay | Admitting: Vascular Surgery

## 2019-05-21 ENCOUNTER — Encounter: Payer: Self-pay | Admitting: Vascular Surgery

## 2019-05-21 ENCOUNTER — Other Ambulatory Visit: Payer: Self-pay

## 2019-05-21 ENCOUNTER — Ambulatory Visit (HOSPITAL_COMMUNITY)
Admission: RE | Admit: 2019-05-21 | Discharge: 2019-05-21 | Disposition: A | Discharge status: Home / Self Care | Payer: 59 | Source: Ambulatory Visit | Attending: Vascular Surgery | Admitting: Vascular Surgery

## 2019-05-21 VITALS — BP 150/92 | HR 64 | Temp 97.7°F | Resp 20 | Ht 73.5 in | Wt 203.5 lb

## 2019-05-21 DIAGNOSIS — I6521 Occlusion and stenosis of right carotid artery: Secondary | ICD-10-CM

## 2019-05-21 NOTE — Progress Notes (Signed)
Patient is a 63 year old male status post right TCAR carotid stenting April 20, 2019.  He has no symptoms of TIA amaurosis or stroke.  He has noticed more easy bruisability being on the combination of Plavix and aspirin.  He has no incisional drainage.  Physical exam:  Vitals:   05/21/19 0834 05/21/19 0836  BP: (!) 155/84 (!) 150/92  Pulse: 64   Resp: 20   Temp: 97.7 F (36.5 C)   SpO2: 100%   Weight: 203 lb 8 oz (92.3 kg)   Height: 6' 1.5" (1.867 m)     Right neck incision well-healed 1 suture removed today no erythema no drainage  Neuro: Symmetric upper extremity lower extreme motor strength no facial asymmetry  Data: Carotid duplex exam today shows widely patent right carotid stent 40 to 60% left internal carotid artery stenosis  Assessment: Doing well status post right TCAR carotid stenting.  Since the patient is having significant bruising and leads a fairly active lifestyle with cycling and other outdoor activities he has some concerns about being on dual antiplatelet therapy.  I discussed with him today that we should leave him on the Plavix for at least 6 weeks until there is complete endothelialization of the stent.  He will stop his Plavix in 2 weeks.  He will continue his aspirin and statin indefinitely.  Plan: #1 stop Plavix in 2 weeks  2.  Continue statin and aspirin indefinitely  3.  Patient will have a follow-up carotid duplex exam in 6 months time and be seen in our APP clinic.  He will return sooner if he has any neurologic symptoms prior to that. Ruta Hinds, MD [facility] [place] Office: [phone] Pager: [phone]

## 2019-07-20 DIAGNOSIS — M1711 Unilateral primary osteoarthritis, right knee: Secondary | ICD-10-CM | POA: Insufficient documentation

## 2019-07-20 DIAGNOSIS — M179 Osteoarthritis of knee, unspecified: Secondary | ICD-10-CM | POA: Diagnosis present

## 2019-07-20 DIAGNOSIS — M1712 Unilateral primary osteoarthritis, left knee: Secondary | ICD-10-CM | POA: Diagnosis present

## 2019-07-21 ENCOUNTER — Other Ambulatory Visit: Payer: Self-pay

## 2019-07-21 ENCOUNTER — Ambulatory Visit (INDEPENDENT_AMBULATORY_CARE_PROVIDER_SITE_OTHER): Payer: 59 | Admitting: Cardiovascular Disease

## 2019-07-21 ENCOUNTER — Encounter: Payer: Self-pay | Admitting: Cardiovascular Disease

## 2019-07-21 VITALS — BP 146/92 | HR 65 | Ht 73.0 in | Wt 208.0 lb

## 2019-07-21 DIAGNOSIS — I739 Peripheral vascular disease, unspecified: Secondary | ICD-10-CM

## 2019-07-21 DIAGNOSIS — E7800 Pure hypercholesterolemia, unspecified: Secondary | ICD-10-CM

## 2019-07-21 DIAGNOSIS — I10 Essential (primary) hypertension: Secondary | ICD-10-CM | POA: Diagnosis not present

## 2019-07-21 MED ORDER — AMLODIPINE BESYLATE 10 MG PO TABS: 10.0000 mg | ORAL_TABLET | Freq: Every day | ORAL | 3 refills | Status: DC

## 2019-07-21 NOTE — Patient Instructions (Signed)
Medication Instructions:  INCREASE the Amlodipine to 10 mg once daily  *If you need a refill on your cardiac medications before your next appointment, please call your pharmacy*  Lab Work: None ordered If you have labs (blood work) drawn today and your tests are completely normal, you will receive your results only by: Marland Kitchen MyChart Message (if you have MyChart) OR . A paper copy in the mail If you have any lab test that is abnormal or we need to change your treatment, we will call you to review the results.  Testing/Procedures: None ordered  Follow-Up: At [facility], you and your health needs are our priority.  As part of our continuing mission to provide you with exceptional heart care, we have created designated Provider Care Teams.  These Care Teams include your primary Cardiologist (physician) and Advanced Practice Providers (APPs -  Physician Assistants and Nurse Practitioners) who all work together to provide you with the care you need, when you need it.  Your next appointment:   12 month(s)  The format for your next appointment:   In Person  Provider:   [name], MD
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
## 2019-08-25 ENCOUNTER — Other Ambulatory Visit: Payer: Self-pay | Admitting: *Deleted

## 2019-08-25 MED ORDER — AMLODIPINE BESYLATE 5 MG PO TABS: 5.0000 mg | ORAL_TABLET | Freq: Every day | ORAL | Status: DC

## 2019-08-25 MED ORDER — LISINOPRIL 10 MG PO TABS: 10.0000 mg | ORAL_TABLET | Freq: Every day | ORAL | 1 refills | Status: DC

## 2019-10-07 ENCOUNTER — Telehealth: Payer: Self-pay | Admitting: Family Medicine

## 2019-10-07 ENCOUNTER — Encounter: Payer: Self-pay | Admitting: Cardiovascular Disease

## 2019-10-07 NOTE — Telephone Encounter (Signed)
Preoperative Clearance Form to be filled out- placed in dr's folder.  Fax form to [phone], Attn: Adline Potter

## 2019-10-09 ENCOUNTER — Encounter: Payer: Self-pay | Admitting: Family Medicine

## 2019-10-09 DIAGNOSIS — Z0279 Encounter for issue of other medical certificate: Secondary | ICD-10-CM

## 2019-10-09 NOTE — Telephone Encounter (Signed)
The form is ready  

## 2019-10-09 NOTE — Telephone Encounter (Signed)
Form has been faxed.

## 2019-10-18 ENCOUNTER — Other Ambulatory Visit: Payer: Self-pay | Admitting: Cardiovascular Disease

## 2019-10-22 ENCOUNTER — Encounter: Payer: Self-pay | Admitting: Family Medicine

## 2019-10-22 ENCOUNTER — Telehealth: Payer: Self-pay | Admitting: Family Medicine

## 2019-10-22 NOTE — Telephone Encounter (Signed)
Form has been faxed back to the requested number

## 2019-10-22 NOTE — Telephone Encounter (Signed)
Terry Holt with [facility] Dr. Theda Sers office is calling in stating that they have not received the surgical clearance form and she was told that it had been faxed 3 times and she has not received it.  Terry Holt would like to see if it can be refaxed to [phone] as soon as possible so that the pt can have his knee surgery.

## 2019-10-29 NOTE — Progress Notes (Signed)
Can you please place some orders for the upcoming surgery.Pt. PST and labs appointment on 10/30/19.

## 2019-10-29 NOTE — H&P (View-Only) (Signed)
Can you please place some orders for the upcoming surgery.Pt. PST and labs appointment on 10/30/19.

## 2019-10-29 NOTE — Patient Instructions (Addendum)
DUE TO COVID-19 ONLY ONE VISITOR IS ALLOWED TO COME WITH YOU AND STAY IN THE WAITING ROOM ONLY DURING PRE OP AND PROCEDURE DAY OF SURGERY. THE 1 VISITOR MAY VISIT WITH YOU AFTER SURGERY IN YOUR PRIVATE ROOM DURING VISITING HOURS ONLY!  YOU NEED TO HAVE A COVID 19 TEST ON: 11/03/19 @ 2:00 pm, THIS TEST MUST BE DONE BEFORE SURGERY, COME  [place], [place] , [id number].  ([place]) ONCE YOUR COVID TEST IS COMPLETED, PLEASE BEGIN THE QUARANTINE INSTRUCTIONS AS OUTLINED IN YOUR HANDOUT.                Terry Holt    Your procedure is scheduled on: 11/06/19   Report to [facility] Main  Entrance   Report to admitting at: 8:00 AM     Call this number if you have problems the morning of surgery [phone]    Remember:   Do not eat solid food :After Midnight.From midnight to 7:30 am you can have clear liquids.      CLEAR LIQUID DIET   Foods Allowed                                                                     Foods Excluded  Coffee and tea, regular and decaf                             liquids that you cannot  Plain Jell-O any favor except red or purple                                           see through such as: Fruit ices (not with fruit pulp)                                     milk, soups, orange juice  Iced Popsicles                                    All solid food Carbonated beverages, regular and diet                                    Cranberry, grape and apple juices Sports drinks like Gatorade Lightly seasoned clear broth or consume(fat free) Sugar, honey syrup  Sample Menu Breakfast                                Lunch                                     Supper Cranberry juice                    Beef broth  Chicken broth Jell-O                                     Grape juice                           Apple juice Coffee or tea                        Jell-O                                       Popsicle                                                Coffee or tea                        Coffee or tea  _____________________________________________________________________   BRUSH YOUR TEETH MORNING OF SURGERY AND RINSE YOUR MOUTH OUT, NO CHEWING GUM CANDY OR MINTS.     Take these medicines the morning of surgery with A SIP OF WATER: amlodipine,levothyroxine.                                 You may not have any metal on your body including hair pins and              piercings  Do not wear jewelry,lotions, powders or perfumes, deodorant             Men may shave face and neck. to surgery   Do not bring valuables to the hospital. [place].  Contacts, dentures or bridgework may not be worn into surgery.  Leave suitcase in the car. After surgery it may be brought to your room.     Patients discharged the day of surgery will not be allowed to drive home. IF YOU ARE HAVING SURGERY AND GOING HOME THE SAME DAY, YOU MUST HAVE AN ADULT TO DRIVE YOU HOME AND BE WITH YOU FOR 24 HOURS. YOU MAY GO HOME BY TAXI OR UBER OR ORTHERWISE, BUT AN ADULT MUST ACCOMPANY YOU HOME AND STAY WITH YOU FOR 24 HOURS.  Name and phone number of your driver:  Special Instructions: N/A              Please read over the following fact sheets you were given: _____________________________________________________________________             [facility] - Preparing for Surgery Before surgery, you can play an important role.  Because skin is not sterile, your skin needs to be as free of germs as possible.  You can reduce the number of germs on your skin by washing with CHG (chlorahexidine gluconate) soap before surgery.  CHG is an antiseptic cleaner which kills germs and bonds with the skin to continue killing germs even after washing. Please DO NOT use if you have an allergy to CHG or antibacterial soaps.  If your skin becomes  reddened/irritated stop using the CHG  and inform your nurse when you arrive at Short Stay. Do not shave (including legs and underarms) for at least 48 hours prior to the first CHG shower.  You may shave your face/neck. Please follow these instructions carefully:  1.  Shower with CHG Soap the night before surgery and the  morning of Surgery.  2.  If you choose to wash your hair, wash your hair first as usual with your  normal  shampoo.  3.  After you shampoo, rinse your hair and body thoroughly to remove the  shampoo.                           4.  Use CHG as you would any other liquid soap.  You can apply chg directly  to the skin and wash                       Gently with a scrungie or clean washcloth.  5.  Apply the CHG Soap to your body ONLY FROM THE NECK DOWN.   Do not use on face/ open                           Wound or open sores. Avoid contact with eyes, ears mouth and genitals (private parts).                       Wash face,  Genitals (private parts) with your normal soap.             6.  Wash thoroughly, paying special attention to the area where your surgery  will be performed.  7.  Thoroughly rinse your body with warm water from the neck down.  8.  DO NOT shower/wash with your normal soap after using and rinsing off  the CHG Soap.                9.  Pat yourself dry with a clean towel.            10.  Wear clean pajamas.            11.  Place clean sheets on your bed the night of your first shower and do not  sleep with pets. Day of Surgery : Do not apply any lotions/deodorants the morning of surgery.  Please wear clean clothes to the hospital/surgery center.  FAILURE TO FOLLOW THESE INSTRUCTIONS MAY RESULT IN THE CANCELLATION OF YOUR SURGERY PATIENT SIGNATURE_________________________________  NURSE SIGNATURE__________________________________  ________________________________________________________________________

## 2019-10-30 ENCOUNTER — Other Ambulatory Visit: Payer: Self-pay

## 2019-10-30 ENCOUNTER — Encounter (HOSPITAL_COMMUNITY)
Admission: RE | Admit: 2019-10-30 | Discharge: 2019-10-30 | Disposition: A | Discharge status: Home / Self Care | Payer: 59 | Source: Ambulatory Visit | Attending: Specialist | Admitting: Specialist

## 2019-10-30 ENCOUNTER — Encounter (HOSPITAL_COMMUNITY): Payer: Self-pay

## 2019-10-30 DIAGNOSIS — Z01812 Encounter for preprocedural laboratory examination: Secondary | ICD-10-CM | POA: Insufficient documentation

## 2019-10-30 LAB — CBC
HCT: 42.5 % (ref 39.0–52.0)
Hemoglobin: 14.9 g/dL (ref 13.0–17.0)
MCH: 32.7 pg (ref 26.0–34.0)
MCHC: 35.1 g/dL (ref 30.0–36.0)
MCV: 93.2 fL (ref 80.0–100.0)
Platelets: 201 10*3/uL (ref 150–400)
RBC: 4.56 MIL/uL (ref 4.22–5.81)
RDW: 12.4 % (ref 11.5–15.5)
WBC: 6.4 10*3/uL (ref 4.0–10.5)
nRBC: 0 % (ref 0.0–0.2)

## 2019-10-30 LAB — BASIC METABOLIC PANEL
Anion gap: 11 (ref 5–15)
BUN: 12 mg/dL (ref 8–23)
CO2: 27 mmol/L (ref 22–32)
Calcium: 9.5 mg/dL (ref 8.9–10.3)
Chloride: 99 mmol/L (ref 98–111)
Creatinine, Ser: 0.92 mg/dL (ref 0.61–1.24)
GFR calc Af Amer: >60 mL/min (ref >60)
GFR calc non Af Amer: >60 mL/min (ref >60)
Glucose, Bld: 134 mg/dL — ABNORMAL HIGH (ref 70–99)
Potassium: 3.6 mmol/L (ref 3.5–5.1)
Sodium: 137 mmol/L (ref 135–145)

## 2019-10-30 LAB — SURGICAL PCR SCREEN
MRSA, PCR: NEGATIVE
Staphylococcus aureus: NEGATIVE

## 2019-10-30 NOTE — Progress Notes (Signed)
PCP - Dr. Barbie Banner. LOV: 07/21/19 Cardiologist - [name] M. LOV: 07/21/19  Chest x-ray -  EKG - 07/21/19 Stress Test -  ECHO -  Cardiac Cath -  Carotic Duplex: 05/21/19 Sleep Study -  CPAP -   Fasting Blood Sugar -  Checks Blood Sugar _____ times a day  Blood Thinner Instructions: Aspirin Instructions: Last Dose:  Anesthesia review:   Patient denies shortness of breath, fever, cough and chest pain at PAT appointment   Patient verbalized understanding of instructions that were given to them at the PAT appointment. Patient was also instructed that they will need to review over the PAT instructions again at home before surgery.

## 2019-10-30 NOTE — Progress Notes (Deleted)
DUE TO COVID-19 ONLY ONE VISITOR IS ALLOWED TO COME WITH YOU AND STAY IN THE WAITING ROOM ONLY DURING PRE OP AND PROCEDURE DAY OF SURGERY. THE 1 VISITOR MAY VISIT WITH YOU AFTER SURGERY IN YOUR PRIVATE ROOM DURING VISITING HOURS ONLY!  YOU NEED TO HAVE A COVID 19 TEST ON_______ @_______ , THIS TEST MUST BE DONE BEFORE SURGERY, COME  [name], [name] , [id number].  ([place]) ONCE YOUR COVID TEST IS COMPLETED, PLEASE BEGIN THE QUARANTINE INSTRUCTIONS AS OUTLINED IN YOUR HANDOUT.                Terry Holt  10/30/2019   Your procedure is scheduled on:    Report to [facility] Main  Entrance   Report to admitting at AM     Call this number if you have problems the morning of surgery [phone]    Remember: Do not eat food or drink liquids :After Midnight. BRUSH YOUR TEETH MORNING OF SURGERY AND RINSE YOUR MOUTH OUT, NO CHEWING GUM CANDY OR MINTS.     Take these medicines the morning of surgery with A SIP OF WATER:   DO NOT TAKE ANY DIABETIC MEDICATIONS DAY OF YOUR SURGERY                               You may not have any metal on your body including hair pins and              piercings  Do not wear jewelry, make-up, lotions, powders or perfumes, deodorant             Do not wear nail polish on your fingernails.  Do not shave  48 hours prior to surgery.              Men may shave face and neck.   Do not bring valuables to the hospital. [name]  Contacts, dentures or bridgework may not be worn into surgery.  Leave suitcase in the car. After surgery it may be brought to your room.     Patients discharged the day of surgery will not be allowed to drive home. IF YOU ARE HAVING SURGERY AND GOING HOME THE SAME DAY, YOU MUST HAVE AN ADULT TO DRIVE YOU HOME AND BE WITH YOU FOR 24 HOURS. YOU MAY GO HOME BY TAXI OR UBER OR ORTHERWISE, BUT AN ADULT MUST ACCOMPANY YOU HOME AND STAY WITH YOU FOR 24  HOURS.  Name and phone number of your driver:  Special Instructions: N/A              Please read over the following fact sheets you were given: _____________________________________________________________________             [facility] - Preparing for Surgery Before surgery, you can play an important role.  Because skin is not sterile, your skin needs to be as free of germs as possible.  You can reduce the number of germs on your skin by washing with CHG (chlorahexidine gluconate) soap before surgery.  CHG is an antiseptic cleaner which kills germs and bonds with the skin to continue killing germs even after washing. Please DO NOT use if you have an allergy to CHG or antibacterial soaps.  If your skin becomes reddened/irritated stop using the CHG and inform your  nurse when you arrive at Short Stay. Do not shave (including legs and underarms) for at least 48 hours prior to the first CHG shower.  You may shave your face/neck. Please follow these instructions carefully:  1.  Shower with CHG Soap the night before surgery and the  morning of Surgery.  2.  If you choose to wash your hair, wash your hair first as usual with your  normal  shampoo.  3.  After you shampoo, rinse your hair and body thoroughly to remove the  shampoo.                           4.  Use CHG as you would any other liquid soap.  You can apply chg directly  to the skin and wash                       Gently with a scrungie or clean washcloth.  5.  Apply the CHG Soap to your body ONLY FROM THE NECK DOWN.   Do not use on face/ open                           Wound or open sores. Avoid contact with eyes, ears mouth and genitals (private parts).                       Wash face,  Genitals (private parts) with your normal soap.             6.  Wash thoroughly, paying special attention to the area where your surgery  will be performed.  7.  Thoroughly rinse your body with warm water from the neck down.  8.  DO NOT shower/wash with  your normal soap after using and rinsing off  the CHG Soap.                9.  Pat yourself dry with a clean towel.            10.  Wear clean pajamas.            11.  Place clean sheets on your bed the night of your first shower and do not  sleep with pets. Day of Surgery : Do not apply any lotions/deodorants the morning of surgery.  Please wear clean clothes to the hospital/surgery center.  FAILURE TO FOLLOW THESE INSTRUCTIONS MAY RESULT IN THE CANCELLATION OF YOUR SURGERY PATIENT SIGNATURE_________________________________  NURSE SIGNATURE__________________________________  ________________________________________________________________________   Terry Holt  An incentive spirometer is a tool that can help keep your lungs clear and active. This tool measures how well you are filling your lungs with each breath. Taking long deep breaths may help reverse or decrease the chance of developing breathing (pulmonary) problems (especially infection) following:  A long period of time when you are unable to move or be active. BEFORE THE PROCEDURE   If the spirometer includes an indicator to show your best effort, your nurse or respiratory therapist will set it to a desired goal.  If possible, sit up straight or lean slightly forward. Try not to slouch.  Hold the incentive spirometer in an upright position. INSTRUCTIONS FOR USE  1. Sit on the edge of your bed if possible, or sit up as far as you can in bed or on a chair. 2. Hold the incentive spirometer in an upright position. 3. Breathe  out normally. 4. Place the mouthpiece in your mouth and seal your lips tightly around it. 5. Breathe in slowly and as deeply as possible, raising the piston or the ball toward the top of the column. 6. Hold your breath for 3-5 seconds or for as long as possible. Allow the piston or ball to fall to the bottom of the column. 7. Remove the mouthpiece from your mouth and breathe out normally. 8. Rest  for a few seconds and repeat Steps 1 through 7 at least 10 times every 1-2 hours when you are awake. Take your time and take a few normal breaths between deep breaths. 9. The spirometer may include an indicator to show your best effort. Use the indicator as a goal to work toward during each repetition. 10. After each set of 10 deep breaths, practice coughing to be sure your lungs are clear. If you have an incision (the cut made at the time of surgery), support your incision when coughing by placing a pillow or rolled up towels firmly against it. Once you are able to get out of bed, walk around indoors and cough well. You may stop using the incentive spirometer when instructed by your caregiver.  RISKS AND COMPLICATIONS  Take your time so you do not get dizzy or light-headed.  If you are in pain, you may need to take or ask for pain medication before doing incentive spirometry. It is harder to take a deep breath if you are having pain. AFTER USE  Rest and breathe slowly and easily.  It can be helpful to keep track of a log of your progress. Your caregiver can provide you with a simple table to help with this. If you are using the spirometer at home, follow these instructions: [place] IF:   You are having difficultly using the spirometer.  You have trouble using the spirometer as often as instructed.  Your pain medication is not giving enough relief while using the spirometer.  You develop fever of 100.5 F (38.1 C) or higher. SEEK IMMEDIATE MEDICAL CARE IF:   You cough up bloody sputum that had not been present before.  You develop fever of 102 F (38.9 C) or greater.  You develop worsening pain at or near the incision site. MAKE SURE YOU:   Understand these instructions.  Will watch your condition.  Will get help right away if you are not doing well or get worse. Document Released: 12/17/2006 Document Revised: 10/29/2011 Document Reviewed: 02/17/2007 [facility]  Patient Information 2014 [place], [place].   ________________________________________________________________________

## 2019-11-03 ENCOUNTER — Other Ambulatory Visit (HOSPITAL_COMMUNITY)
Admission: RE | Admit: 2019-11-03 | Discharge: 2019-11-03 | Disposition: A | Discharge status: Home / Self Care | Payer: 59 | Source: Ambulatory Visit | Attending: Anesthesiology | Admitting: Anesthesiology

## 2019-11-03 DIAGNOSIS — Z01812 Encounter for preprocedural laboratory examination: Secondary | ICD-10-CM | POA: Insufficient documentation

## 2019-11-03 DIAGNOSIS — Z20822 Contact with and (suspected) exposure to covid-19: Secondary | ICD-10-CM | POA: Diagnosis not present

## 2019-11-03 LAB — SARS CORONAVIRUS 2 (TAT 6-24 HRS): SARS Coronavirus 2: NEGATIVE

## 2019-11-05 MED ORDER — BUPIVACAINE LIPOSOME 1.3 % IJ SUSP
20.0000 mL | Freq: Once | INTRAMUSCULAR | Status: DC
  Filled 2019-11-05: qty 20

## 2019-11-06 ENCOUNTER — Encounter (HOSPITAL_COMMUNITY)
Admission: RE | Disposition: A | Discharge status: Home / Self Care | Payer: Self-pay | Source: Other Acute Inpatient Hospital | Attending: Specialist

## 2019-11-06 ENCOUNTER — Ambulatory Visit (HOSPITAL_COMMUNITY): Payer: 59 | Admitting: Certified Registered"

## 2019-11-06 ENCOUNTER — Other Ambulatory Visit: Payer: Self-pay

## 2019-11-06 ENCOUNTER — Observation Stay (HOSPITAL_COMMUNITY)
Admission: RE | Admit: 2019-11-06 | Discharge: 2019-11-07 | Disposition: A | Discharge status: Home / Self Care | Payer: 59 | Source: Other Acute Inpatient Hospital | Attending: Specialist | Admitting: Specialist

## 2019-11-06 ENCOUNTER — Ambulatory Visit (HOSPITAL_COMMUNITY): Payer: 59 | Admitting: Physician Assistant

## 2019-11-06 ENCOUNTER — Encounter (HOSPITAL_COMMUNITY): Payer: Self-pay | Admitting: Specialist

## 2019-11-06 DIAGNOSIS — E039 Hypothyroidism, unspecified: Secondary | ICD-10-CM | POA: Insufficient documentation

## 2019-11-06 DIAGNOSIS — Z923 Personal history of irradiation: Secondary | ICD-10-CM | POA: Diagnosis not present

## 2019-11-06 DIAGNOSIS — Z86010 Personal history of colonic polyps: Secondary | ICD-10-CM | POA: Diagnosis not present

## 2019-11-06 DIAGNOSIS — Z800 Family history of malignant neoplasm of digestive organs: Secondary | ICD-10-CM | POA: Diagnosis not present

## 2019-11-06 DIAGNOSIS — I739 Peripheral vascular disease, unspecified: Secondary | ICD-10-CM | POA: Insufficient documentation

## 2019-11-06 DIAGNOSIS — I10 Essential (primary) hypertension: Secondary | ICD-10-CM | POA: Diagnosis not present

## 2019-11-06 DIAGNOSIS — N401 Enlarged prostate with lower urinary tract symptoms: Secondary | ICD-10-CM | POA: Diagnosis not present

## 2019-11-06 DIAGNOSIS — Z87891 Personal history of nicotine dependence: Secondary | ICD-10-CM | POA: Insufficient documentation

## 2019-11-06 DIAGNOSIS — E785 Hyperlipidemia, unspecified: Secondary | ICD-10-CM | POA: Insufficient documentation

## 2019-11-06 DIAGNOSIS — Z8249 Family history of ischemic heart disease and other diseases of the circulatory system: Secondary | ICD-10-CM | POA: Diagnosis not present

## 2019-11-06 DIAGNOSIS — M1712 Unilateral primary osteoarthritis, left knee: Principal | ICD-10-CM | POA: Insufficient documentation

## 2019-11-06 DIAGNOSIS — C01 Malignant neoplasm of base of tongue: Secondary | ICD-10-CM | POA: Diagnosis not present

## 2019-11-06 DIAGNOSIS — Z9221 Personal history of antineoplastic chemotherapy: Secondary | ICD-10-CM | POA: Diagnosis not present

## 2019-11-06 DIAGNOSIS — Z7952 Long term (current) use of systemic steroids: Secondary | ICD-10-CM | POA: Diagnosis not present

## 2019-11-06 DIAGNOSIS — M179 Osteoarthritis of knee, unspecified: Secondary | ICD-10-CM | POA: Diagnosis present

## 2019-11-06 DIAGNOSIS — Z825 Family history of asthma and other chronic lower respiratory diseases: Secondary | ICD-10-CM | POA: Insufficient documentation

## 2019-11-06 HISTORY — PX: TOTAL KNEE ARTHROPLASTY: SHX125

## 2019-11-06 LAB — TYPE AND SCREEN
ABO/RH(D): O POS
Antibody Screen: NEGATIVE

## 2019-11-06 LAB — CBC
HCT: 39.5 % (ref 39.0–52.0)
Hemoglobin: 13.9 g/dL (ref 13.0–17.0)
MCH: 33.7 pg (ref 26.0–34.0)
MCHC: 35.2 g/dL (ref 30.0–36.0)
MCV: 95.9 fL (ref 80.0–100.0)
Platelets: 181 10*3/uL (ref 150–400)
RBC: 4.12 MIL/uL — ABNORMAL LOW (ref 4.22–5.81)
RDW: 12.3 % (ref 11.5–15.5)
WBC: 5.4 10*3/uL (ref 4.0–10.5)
nRBC: 0 % (ref 0.0–0.2)

## 2019-11-06 LAB — APTT: aPTT: 31 seconds (ref 24–36)

## 2019-11-06 LAB — CREATININE, SERUM
Creatinine, Ser: 0.95 mg/dL (ref 0.61–1.24)
GFR calc Af Amer: >60 mL/min (ref >60)
GFR calc non Af Amer: >60 mL/min (ref >60)

## 2019-11-06 LAB — HIV ANTIBODY (ROUTINE TESTING W REFLEX): HIV Screen 4th Generation wRfx: NONREACTIVE

## 2019-11-06 LAB — ABO/RH: ABO/RH(D): O POS

## 2019-11-06 LAB — PROTIME-INR
INR: 1 (ref 0.8–1.2)
Prothrombin Time: 13.5 seconds (ref 11.4–15.2)

## 2019-11-06 SURGERY — ARTHROPLASTY, KNEE, TOTAL
Anesthesia: Spinal | Site: Knee | Laterality: Left

## 2019-11-06 MED ORDER — PHENYLEPHRINE HCL-NACL 10-0.9 MG/250ML-% IV SOLN
INTRAVENOUS | Status: DC | PRN
  Administered 2019-11-06: 20 ug/min via INTRAVENOUS

## 2019-11-06 MED ORDER — DEXAMETHASONE SODIUM PHOSPHATE 10 MG/ML IJ SOLN
INTRAMUSCULAR | Status: AC
  Filled 2019-11-06: qty 1

## 2019-11-06 MED ORDER — DEXAMETHASONE SODIUM PHOSPHATE 10 MG/ML IJ SOLN
8.0000 mg | Freq: Once | INTRAMUSCULAR | Status: AC
  Administered 2019-11-06: 8 mg via INTRAVENOUS

## 2019-11-06 MED ORDER — POVIDONE-IODINE 10 % EX SWAB: 2.0000 "application " | Freq: Once | CUTANEOUS | Status: DC

## 2019-11-06 MED ORDER — FENTANYL CITRATE (PF) 100 MCG/2ML IJ SOLN
25.0000 ug | INTRAMUSCULAR | Status: DC | PRN
  Administered 2019-11-06: 50 ug via INTRAVENOUS

## 2019-11-06 MED ORDER — ONDANSETRON HCL 4 MG/2ML IJ SOLN
4.0000 mg | Freq: Four times a day (QID) | INTRAMUSCULAR | Status: DC | PRN
  Administered 2019-11-06 – 2019-11-07 (×3): 4 mg via INTRAVENOUS
  Filled 2019-11-06 (×2): qty 2

## 2019-11-06 MED ORDER — LIDOCAINE 2% (20 MG/ML) 5 ML SYRINGE
INTRAMUSCULAR | Status: AC
  Filled 2019-11-06: qty 5

## 2019-11-06 MED ORDER — SODIUM CHLORIDE 0.9 % IV SOLN
INTRAVENOUS | Status: DC | PRN
  Administered 2019-11-06: 80 mL

## 2019-11-06 MED ORDER — CEFAZOLIN SODIUM-DEXTROSE 1-4 GM/50ML-% IV SOLN
1.0000 g | Freq: Three times a day (TID) | INTRAVENOUS | Status: DC
  Administered 2019-11-06 – 2019-11-07 (×2): 1 g via INTRAVENOUS
  Filled 2019-11-06 (×2): qty 50

## 2019-11-06 MED ORDER — LACTATED RINGERS IV SOLN
INTRAVENOUS | Status: DC
  Administered 2019-11-06: 1000 mL via INTRAVENOUS

## 2019-11-06 MED ORDER — SODIUM CHLORIDE (PF) 0.9 % IJ SOLN
INTRAMUSCULAR | Status: AC
  Filled 2019-11-06: qty 50

## 2019-11-06 MED ORDER — LEVOTHYROXINE SODIUM 25 MCG PO TABS
25.0000 ug | ORAL_TABLET | Freq: Every day | ORAL | Status: DC
  Administered 2019-11-07: 06:00:00 25 ug via ORAL
  Filled 2019-11-06: qty 1

## 2019-11-06 MED ORDER — ONDANSETRON HCL 4 MG/2ML IJ SOLN
4.0000 mg | Freq: Once | INTRAMUSCULAR | Status: AC | PRN
  Administered 2019-11-06: 4 mg via INTRAVENOUS

## 2019-11-06 MED ORDER — DIPHENHYDRAMINE HCL 12.5 MG/5ML PO ELIX
12.5000 mg | ORAL_SOLUTION | ORAL | Status: DC | PRN
  Filled 2019-11-06: qty 10

## 2019-11-06 MED ORDER — GABAPENTIN 300 MG PO CAPS
300.0000 mg | ORAL_CAPSULE | Freq: Three times a day (TID) | ORAL | Status: DC
  Administered 2019-11-06 – 2019-11-07 (×3): 300 mg via ORAL
  Filled 2019-11-06 (×3): qty 1

## 2019-11-06 MED ORDER — ROPIVACAINE HCL 5 MG/ML IJ SOLN
INTRAMUSCULAR | Status: DC | PRN
  Administered 2019-11-06: 30 mL via PERINEURAL

## 2019-11-06 MED ORDER — METHOCARBAMOL 500 MG PO TABS
ORAL_TABLET | ORAL | Status: AC
  Administered 2019-11-06: 500 mg via ORAL
  Filled 2019-11-06: qty 1

## 2019-11-06 MED ORDER — HYDROMORPHONE HCL 1 MG/ML IJ SOLN
0.5000 mg | INTRAMUSCULAR | Status: DC | PRN
  Administered 2019-11-06: 1 mg via INTRAVENOUS
  Filled 2019-11-06: qty 1

## 2019-11-06 MED ORDER — TRANEXAMIC ACID-NACL 1000-0.7 MG/100ML-% IV SOLN
1000.0000 mg | INTRAVENOUS | Status: AC
  Administered 2019-11-06: 1000 mg via INTRAVENOUS
  Filled 2019-11-06: qty 100

## 2019-11-06 MED ORDER — MIDAZOLAM HCL 2 MG/2ML IJ SOLN
INTRAMUSCULAR | Status: AC
  Filled 2019-11-06: qty 2

## 2019-11-06 MED ORDER — ATORVASTATIN CALCIUM 10 MG PO TABS
10.0000 mg | ORAL_TABLET | Freq: Every day | ORAL | Status: DC
  Administered 2019-11-07: 10 mg via ORAL
  Filled 2019-11-06 (×2): qty 1

## 2019-11-06 MED ORDER — EPHEDRINE 5 MG/ML INJ
INTRAVENOUS | Status: AC
  Filled 2019-11-06: qty 10

## 2019-11-06 MED ORDER — ONDANSETRON HCL 4 MG PO TABS
4.0000 mg | ORAL_TABLET | Freq: Four times a day (QID) | ORAL | Status: DC | PRN
  Filled 2019-11-06: qty 1

## 2019-11-06 MED ORDER — CHLORHEXIDINE GLUCONATE 4 % EX LIQD: 60.0000 mL | Freq: Once | CUTANEOUS | Status: DC

## 2019-11-06 MED ORDER — OXYCODONE HCL 5 MG PO TABS
10.0000 mg | ORAL_TABLET | ORAL | Status: DC | PRN
  Administered 2019-11-06: 10 mg via ORAL
  Filled 2019-11-06: qty 2

## 2019-11-06 MED ORDER — EPHEDRINE SULFATE-NACL 50-0.9 MG/10ML-% IV SOSY
PREFILLED_SYRINGE | INTRAVENOUS | Status: DC | PRN
  Administered 2019-11-06: 10 mg via INTRAVENOUS

## 2019-11-06 MED ORDER — SODIUM CHLORIDE 0.9 % IV SOLN: INTRAVENOUS | Status: DC

## 2019-11-06 MED ORDER — LACTATED RINGERS IV SOLN: INTRAVENOUS | Status: DC

## 2019-11-06 MED ORDER — ONDANSETRON HCL 4 MG/2ML IJ SOLN
INTRAMUSCULAR | Status: AC
  Filled 2019-11-06: qty 2

## 2019-11-06 MED ORDER — FENTANYL CITRATE (PF) 100 MCG/2ML IJ SOLN
50.0000 ug | INTRAMUSCULAR | Status: DC
  Administered 2019-11-06 (×2): 50 ug via INTRAVENOUS
  Filled 2019-11-06: qty 2

## 2019-11-06 MED ORDER — ONDANSETRON HCL 4 MG PO TABS: 4.0000 mg | ORAL_TABLET | Freq: Every day | ORAL | 1 refills | Status: DC | PRN

## 2019-11-06 MED ORDER — LIDOCAINE 2% (20 MG/ML) 5 ML SYRINGE
INTRAMUSCULAR | Status: DC | PRN
  Administered 2019-11-06: 40 mg via INTRAVENOUS

## 2019-11-06 MED ORDER — OXYCODONE HCL 5 MG PO TABS
ORAL_TABLET | ORAL | Status: AC
  Administered 2019-11-06: 5 mg via ORAL
  Filled 2019-11-06: qty 1

## 2019-11-06 MED ORDER — MAGNESIUM CITRATE PO SOLN: 1.0000 | Freq: Once | ORAL | Status: DC | PRN

## 2019-11-06 MED ORDER — MIDAZOLAM HCL 2 MG/2ML IJ SOLN
1.0000 mg | INTRAMUSCULAR | Status: DC
  Administered 2019-11-06 (×2): 1 mg via INTRAVENOUS
  Filled 2019-11-06: qty 2

## 2019-11-06 MED ORDER — PROPOFOL 10 MG/ML IV BOLUS
INTRAVENOUS | Status: AC
  Filled 2019-11-06: qty 20

## 2019-11-06 MED ORDER — PROPOFOL 1000 MG/100ML IV EMUL
INTRAVENOUS | Status: AC
  Filled 2019-11-06: qty 100

## 2019-11-06 MED ORDER — OXYCODONE HCL 5 MG PO TABS: 5.0000 mg | ORAL_TABLET | ORAL | 0 refills | Status: DC | PRN

## 2019-11-06 MED ORDER — OXYCODONE HCL 5 MG PO TABS: 5.0000 mg | ORAL_TABLET | Freq: Once | ORAL | Status: DC | PRN

## 2019-11-06 MED ORDER — AMLODIPINE BESYLATE 5 MG PO TABS
5.0000 mg | ORAL_TABLET | Freq: Every day | ORAL | Status: DC
  Administered 2019-11-07: 5 mg via ORAL
  Filled 2019-11-06 (×2): qty 1

## 2019-11-06 MED ORDER — TRAMADOL HCL 50 MG PO TABS
50.0000 mg | ORAL_TABLET | Freq: Four times a day (QID) | ORAL | Status: DC
  Administered 2019-11-07 (×2): 50 mg via ORAL
  Filled 2019-11-06 (×2): qty 1

## 2019-11-06 MED ORDER — ACETAMINOPHEN 325 MG PO TABS: 325.0000 mg | ORAL_TABLET | Freq: Four times a day (QID) | ORAL | Status: DC | PRN

## 2019-11-06 MED ORDER — METHOCARBAMOL 500 MG PO TABS
500.0000 mg | ORAL_TABLET | Freq: Four times a day (QID) | ORAL | Status: DC | PRN
  Administered 2019-11-06 – 2019-11-07 (×2): 500 mg via ORAL
  Filled 2019-11-06 (×2): qty 1

## 2019-11-06 MED ORDER — METHOCARBAMOL 500 MG IVPB - SIMPLE MED
500.0000 mg | Freq: Four times a day (QID) | INTRAVENOUS | Status: DC | PRN
  Filled 2019-11-06: qty 50

## 2019-11-06 MED ORDER — 0.9 % SODIUM CHLORIDE (POUR BTL) OPTIME
TOPICAL | Status: DC | PRN
  Administered 2019-11-06: 1000 mL

## 2019-11-06 MED ORDER — SENNOSIDES-DOCUSATE SODIUM 8.6-50 MG PO TABS
1.0000 | ORAL_TABLET | Freq: Every evening | ORAL | Status: DC | PRN
  Filled 2019-11-06: qty 1

## 2019-11-06 MED ORDER — BISACODYL 5 MG PO TBEC
5.0000 mg | DELAYED_RELEASE_TABLET | Freq: Every day | ORAL | Status: DC | PRN
  Filled 2019-11-06: qty 1

## 2019-11-06 MED ORDER — METHOCARBAMOL 500 MG PO TABS: 500.0000 mg | ORAL_TABLET | Freq: Four times a day (QID) | ORAL | 0 refills | Status: DC

## 2019-11-06 MED ORDER — OXYCODONE HCL 5 MG PO TABS
5.0000 mg | ORAL_TABLET | ORAL | Status: DC | PRN
  Administered 2019-11-06 – 2019-11-07 (×3): 10 mg via ORAL
  Filled 2019-11-06 (×3): qty 2

## 2019-11-06 MED ORDER — ACETAMINOPHEN 500 MG PO TABS
1000.0000 mg | ORAL_TABLET | Freq: Four times a day (QID) | ORAL | Status: DC
  Administered 2019-11-06 – 2019-11-07 (×3): 1000 mg via ORAL
  Filled 2019-11-06 (×3): qty 2

## 2019-11-06 MED ORDER — PROPOFOL 10 MG/ML IV BOLUS
INTRAVENOUS | Status: DC | PRN
  Administered 2019-11-06: 20 mg via INTRAVENOUS

## 2019-11-06 MED ORDER — FENTANYL CITRATE (PF) 100 MCG/2ML IJ SOLN
INTRAMUSCULAR | Status: AC
  Administered 2019-11-06: 16:00:00 50 ug via INTRAVENOUS
  Filled 2019-11-06: qty 2

## 2019-11-06 MED ORDER — PROPOFOL 500 MG/50ML IV EMUL
INTRAVENOUS | Status: AC
  Filled 2019-11-06: qty 50

## 2019-11-06 MED ORDER — OXYCODONE HCL 5 MG/5ML PO SOLN: 5.0000 mg | Freq: Once | ORAL | Status: DC | PRN

## 2019-11-06 MED ORDER — PHENYLEPHRINE 40 MCG/ML (10ML) SYRINGE FOR IV PUSH (FOR BLOOD PRESSURE SUPPORT)
PREFILLED_SYRINGE | INTRAVENOUS | Status: DC | PRN
  Administered 2019-11-06: 80 ug via INTRAVENOUS
  Administered 2019-11-06 (×2): 40 ug via INTRAVENOUS

## 2019-11-06 MED ORDER — SODIUM CHLORIDE 0.9 % IR SOLN
Status: DC | PRN
  Administered 2019-11-06: 1

## 2019-11-06 MED ORDER — ENOXAPARIN SODIUM 30 MG/0.3ML ~~LOC~~ SOLN
30.0000 mg | Freq: Two times a day (BID) | SUBCUTANEOUS | Status: DC
  Administered 2019-11-07: 30 mg via SUBCUTANEOUS
  Filled 2019-11-06: qty 0.3

## 2019-11-06 MED ORDER — CEFAZOLIN SODIUM-DEXTROSE 2-4 GM/100ML-% IV SOLN
2.0000 g | INTRAVENOUS | Status: AC
  Administered 2019-11-06: 2 g via INTRAVENOUS
  Filled 2019-11-06: qty 100

## 2019-11-06 MED ORDER — PROPOFOL 500 MG/50ML IV EMUL
INTRAVENOUS | Status: DC | PRN
  Administered 2019-11-06: 100 ug/kg/min via INTRAVENOUS

## 2019-11-06 MED ORDER — LISINOPRIL 10 MG PO TABS
10.0000 mg | ORAL_TABLET | Freq: Every day | ORAL | Status: DC
  Administered 2019-11-07: 10 mg via ORAL
  Filled 2019-11-06: qty 1

## 2019-11-06 MED ORDER — BUPIVACAINE IN DEXTROSE 0.75-8.25 % IT SOLN
INTRATHECAL | Status: DC | PRN
  Administered 2019-11-06: 2 mL via INTRATHECAL

## 2019-11-06 SURGICAL SUPPLY — 69 items
ATTUNE MED DOME PAT 38 KNEE (Knees) ×1 IMPLANT
ATTUNE MED DOME PAT 38MM KNEE (Knees) ×1 IMPLANT
ATTUNE PS FEM LT SZ 8 CEM KNEE (Femur) ×2 IMPLANT
ATTUNE PSRP INSR SZ8 7 KNEE (Insert) ×1 IMPLANT
ATTUNE PSRP INSR SZ8 7MM KNEE (Insert) ×1 IMPLANT
BAG ZIPLOCK 12X15 (MISCELLANEOUS) ×6 IMPLANT
BASE TIBIAL ROT PLAT SZ 8 KNEE (Knees) IMPLANT
BLADE SAG 18X100X1.27 (BLADE) ×3 IMPLANT
BLADE SAW SGTL 11.0X1.19X90.0M (BLADE) ×3 IMPLANT
BNDG ELASTIC 4X5.8 VLCR STR LF (GAUZE/BANDAGES/DRESSINGS) ×3 IMPLANT
BNDG ELASTIC 6X5.8 VLCR STR LF (GAUZE/BANDAGES/DRESSINGS) ×3 IMPLANT
BOWL SMART MIX CTS (DISPOSABLE) ×3 IMPLANT
CEMENT HV SMART SET (Cement) ×4 IMPLANT
COVER SURGICAL LIGHT HANDLE (MISCELLANEOUS) ×3 IMPLANT
COVER WAND RF STERILE (DRAPES) ×2 IMPLANT
CUFF TOURN SGL QUICK 34 (TOURNIQUET CUFF) ×3
CUFF TRNQT CYL 34X4.125X (TOURNIQUET CUFF) ×1 IMPLANT
DECANTER SPIKE VIAL GLASS SM (MISCELLANEOUS) ×3 IMPLANT
DERMABOND ADVANCED (GAUZE/BANDAGES/DRESSINGS) ×2
DERMABOND ADVANCED .7 DNX12 (GAUZE/BANDAGES/DRESSINGS) ×1 IMPLANT
DRAPE U-SHAPE 47X51 STRL (DRAPES) ×3 IMPLANT
DRESSING AQUACEL AG SP 3.5X10 (GAUZE/BANDAGES/DRESSINGS) IMPLANT
DRSG AQUACEL AG ADV 3.5X10 (GAUZE/BANDAGES/DRESSINGS) ×3 IMPLANT
DRSG AQUACEL AG SP 3.5X10 (GAUZE/BANDAGES/DRESSINGS) ×3
DRSG TEGADERM 4X4.75 (GAUZE/BANDAGES/DRESSINGS) ×3 IMPLANT
DURAPREP 26ML APPLICATOR (WOUND CARE) ×6 IMPLANT
ELECT REM PT RETURN 15FT ADLT (MISCELLANEOUS) ×3 IMPLANT
EVACUATOR 1/8 PVC DRAIN (DRAIN) ×3 IMPLANT
GAUZE SPONGE 2X2 8PLY STRL LF (GAUZE/BANDAGES/DRESSINGS) ×1 IMPLANT
GLOVE BIOGEL PI IND STRL 7.5 (GLOVE) ×1 IMPLANT
GLOVE BIOGEL PI IND STRL 8 (GLOVE) ×1 IMPLANT
GLOVE BIOGEL PI INDICATOR 7.5 (GLOVE) ×2
GLOVE BIOGEL PI INDICATOR 8 (GLOVE) ×2
GLOVE ECLIPSE 8.0 STRL XLNG CF (GLOVE) ×3 IMPLANT
GLOVE SURG ORTHO 9.0 STRL STRW (GLOVE) ×3 IMPLANT
GLOVE SURG SS PI 7.0 STRL IVOR (GLOVE) ×3 IMPLANT
GOWN STRL REUS W/TWL XL LVL3 (GOWN DISPOSABLE) ×6 IMPLANT
HANDPIECE INTERPULSE COAX TIP (DISPOSABLE) ×3
HOLDER FOLEY CATH W/STRAP (MISCELLANEOUS) IMPLANT
JET LAVAGE IRRISEPT WOUND (IRRIGATION / IRRIGATOR) ×3
KIT TURNOVER KIT A (KITS) IMPLANT
LAVAGE JET IRRISEPT WOUND (IRRIGATION / IRRIGATOR) ×1 IMPLANT
NS IRRIG 1000ML POUR BTL (IV SOLUTION) ×3 IMPLANT
PACK TOTAL KNEE CUSTOM (KITS) ×3 IMPLANT
PENCIL SMOKE EVACUATOR (MISCELLANEOUS) IMPLANT
PIN DRILL FIX HALF THREAD (BIT) ×2 IMPLANT
PIN STEINMAN FIXATION KNEE (PIN) ×2 IMPLANT
PROTECTOR NERVE ULNAR (MISCELLANEOUS) ×3 IMPLANT
SET HNDPC FAN SPRY TIP SCT (DISPOSABLE) ×1 IMPLANT
SET PAD KNEE POSITIONER (MISCELLANEOUS) ×3 IMPLANT
SPONGE GAUZE 2X2 STER 10/PKG (GAUZE/BANDAGES/DRESSINGS) ×2
SPONGE LAP 18X18 RF (DISPOSABLE) IMPLANT
SPONGE SURGIFOAM ABS GEL 100 (HEMOSTASIS) ×3 IMPLANT
STOCKINETTE 6  STRL (DRAPES) ×3
STOCKINETTE 6 STRL (DRAPES) ×1 IMPLANT
SUT BONE WAX W31G (SUTURE) IMPLANT
SUT MNCRL AB 3-0 PS2 18 (SUTURE) ×3 IMPLANT
SUT VIC AB 1 CT1 27 (SUTURE) ×9
SUT VIC AB 1 CT1 27XBRD ANTBC (SUTURE) ×3 IMPLANT
SUT VIC AB 2-0 CT1 27 (SUTURE) ×6
SUT VIC AB 2-0 CT1 TAPERPNT 27 (SUTURE) ×2 IMPLANT
SUT VLOC 180 0 24IN GS25 (SUTURE) ×3 IMPLANT
SYR 50ML LL SCALE MARK (SYRINGE) IMPLANT
TAPE STRIPS DRAPE STRL (GAUZE/BANDAGES/DRESSINGS) ×3 IMPLANT
TIBIAL BASE ROT PLAT SZ 8 KNEE (Knees) ×3 IMPLANT
TRAY CATH 16FR W/PLASTIC CATH (SET/KITS/TRAYS/PACK) ×3 IMPLANT
WATER STERILE IRR 1000ML POUR (IV SOLUTION) ×6 IMPLANT
WRAP KNEE MAXI GEL POST OP (GAUZE/BANDAGES/DRESSINGS) ×3 IMPLANT
YANKAUER SUCT BULB TIP 10FT TU (MISCELLANEOUS) ×3 IMPLANT

## 2019-11-06 NOTE — Op Note (Signed)
DATE OF SURGERY:  11/06/2019  TIME: 12:53 PM  PATIENT NAME:  Terry Holt    AGE: 64 y.o.   PRE-OPERATIVE DIAGNOSIS:  Left knee osteoarthritis  POST-OPERATIVE DIAGNOSIS:  Left knee osteoarthritis  PROCEDURE:  Procedure(s): TOTAL KNEE ARTHROPLASTY  SURGEON:  Jacklyn Branan ANDREW  ASSISTANT:  Leeanne Haus, PA-C, present and scrubbed throughout the case, critical for assistance with exposure, retraction, instrumentation, and closure.  OPERATIVE IMPLANTS: Depuy PFC Attune Rotating Platform.  Femur size 8, Tibia size 8, Patella size 38 3-peg oval button, with a 7 mm polyethylene insert.   PREOPERATIVE INDICATIONS:   Terry Holt is a 64 y.o. year old male with end stage bone on bone arthritis of the knee who failed conservative treatment and elected for Total Knee Arthroplasty.   The risks, benefits, and alternatives were discussed at length including but not limited to the risks of infection, bleeding, nerve injury, stiffness, blood clots, the need for revision surgery, cardiopulmonary complications, among others, and they were willing to proceed.  OPERATIVE DESCRIPTION:  The patient was brought to the operative room and placed in a supine position.  Spinal anesthesia was administered.  IV antibiotics were given.  The lower extremity was prepped and draped in the usual sterile fashion.  Time out was performed.  The leg was elevated and exsanguinated and the tourniquet was inflated.  Anterior quadriceps tendon splitting approach was performed.  The patella was retracted and osteophytes were removed.  The anterior horn of the medial and lateral meniscus was removed and cruciate ligaments resected.   The distal femur was opened with the drill and the intramedullary distal femoral cutting jig was utilized, set at 5 degrees resecting 10 mm off the distal femur.  Care was taken to protect the collateral ligaments.  The distal femoral sizing jig was applied, taking care to avoid  notching.  Then the 4-in-1 cutting jig was applied and the anterior and posterior femur was cut, along with the chamfer cuts.    Then the extramedullary tibial cutting jig was utilized making the appropriate cut using the anterior tibial crest as a reference building in appropriate posterior slope.  Care was taken during the cut to protect the medial and collateral ligaments.  The proximal tibia was removed along with the posterior horns of the menisci.   The posterior medial femoral osteophytes and posterior lateral femoral osteophytes were removed.    The flexion gap was then measured and was symmetric with the extension gap, measured at 7.  I completed the distal femoral preparation using the appropriate jig to prepare the box.  The patella was then measured, and cut with the saw.    The proximal tibia sized and prepared accordingly with the reamer and the punch, and then all components were trialed with the trial insert.  The knee was found to have excellent balance and full motion.    The above named components were then cemented into place and all excess cement was removed.  The trial polyethylene component was in place during cementation, and then was exchanged for the real polyethylene component.    The knee was easily taken through a range of motion and the patella tracked well and the knee irrigated copiously and the parapatellar and subcutaneous tissue closed with vicryl, and monocryl with steri strips for the skin.  The arthrotomy was closed at 90 of flexion. The wounds were dressed with sterile gauze and the tourniquet released and the patient was awakened and returned to the PACU in  stable and satisfactory condition.  There were no complications.  Total tourniquet time was 90 minutes.

## 2019-11-06 NOTE — Interval H&P Note (Signed)
History and Physical Interval Note:  11/06/2019 9:56 AM  Terry Holt  has presented today for surgery, with the diagnosis of Left knee osteoarthritis.  The various methods of treatment have been discussed with the patient and family. After consideration of risks, benefits and other options for treatment, the patient has consented to  Procedure(s) with comments: TOTAL KNEE ARTHROPLASTY (Left) - adductor canal as a surgical intervention.  The patient's history has been reviewed, patient examined, no change in status, stable for surgery.  I have reviewed the patient's chart and labs.  Questions were answered to the patient's satisfaction.     Dieter Hane ANDREW

## 2019-11-06 NOTE — Progress Notes (Signed)
Dr. Theda Sers notified that pt is c/o bilateral testical pain radiating in groin, nauseated. Pt given pain and nausea meds. Pt resting in bed, report given to Amy,RN.
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
## 2019-11-06 NOTE — Interval H&P Note (Signed)
History and Physical Interval Note:  11/06/2019 10:16 AM  Terry Holt  has presented today for surgery, with the diagnosis of Left knee osteoarthritis.  The various methods of treatment have been discussed with the patient and family. After consideration of risks, benefits and other options for treatment, the patient has consented to  Procedure(s) with comments: TOTAL KNEE ARTHROPLASTY (Left) - adductor canal as a surgical intervention.  The patient's history has been reviewed, patient examined, no change in status, stable for surgery.  I have reviewed the patient's chart and labs.  Questions were answered to the patient's satisfaction.     Ommie Degeorge ANDREW
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
## 2019-11-06 NOTE — Anesthesia Procedure Notes (Signed)
Spinal  Patient location during procedure: OR Start time: 11/06/2019 10:43 AM Staffing Performed: resident/CRNA  Anesthesiologist: Lidia Collum, MD Resident/CRNA: Eben Burow, CRNA Preanesthetic Checklist Completed: patient identified, IV checked, site marked, risks and benefits discussed, surgical consent, monitors and equipment checked, pre-op evaluation and timeout performed Spinal Block Patient position: sitting Prep: DuraPrep and site prepped and draped Patient monitoring: heart rate, cardiac monitor, continuous pulse ox and blood pressure Approach: midline Location: L3-4 Injection technique: single-shot Needle Needle type: Pencan  Needle gauge: 24 G Needle length: 10 cm Assessment Sensory level: T4 Additional Notes Pt placed in sitting position, spinal kit expiration date checked and verified, + CSF, - heme, pt tolerated well. Dr Christella Hartigan present and supervising throughout [place].

## 2019-11-06 NOTE — Anesthesia Postprocedure Evaluation (Signed)
Anesthesia Post Note  Patient: Terry Holt  Procedure(s) Performed: TOTAL KNEE ARTHROPLASTY (Left Knee)     Patient location during evaluation: PACU Anesthesia Type: Spinal Level of consciousness: oriented and awake and alert Pain management: pain level controlled Vital Signs Assessment: post-procedure vital signs reviewed and stable Respiratory status: spontaneous breathing, respiratory function stable and nonlabored ventilation Cardiovascular status: blood pressure returned to baseline and stable Postop Assessment: no headache, no backache, no apparent nausea or vomiting and spinal receding Anesthetic complications: no    Last Vitals:  Vitals:   11/06/19 1530 11/06/19 1554  BP: 126/68 (!) 144/70  Pulse: 71 82  Resp: 12 18  Temp:  (!) 36.4 C  SpO2: 100% 100%    Last Pain:  Vitals:   11/06/19 1613  TempSrc:   PainSc: 7     LLE Motor Response: Purposeful movement (11/06/19 1614) LLE Sensation: Tingling (11/06/19 1614) RLE Motor Response: Purposeful movement (11/06/19 1614)   L Sensory Level: S1-Sole of foot, small toes (11/06/19 1614) R Sensory Level: S1-Sole of foot, small toes (11/06/19 1614)  Lidia Collum

## 2019-11-06 NOTE — Progress Notes (Signed)
AssistedDr. Carolyn Witman with left, ultrasound guided, adductor canal block. Side rails up, monitors on throughout procedure. See vital signs in flow sheet. Tolerated Procedure well.  

## 2019-11-06 NOTE — Transfer of Care (Signed)
Immediate Anesthesia Transfer of Care Note  Patient: Terry Holt  Procedure(s) Performed: TOTAL KNEE ARTHROPLASTY (Left Knee)  Patient Location: PACU  Anesthesia Type:Spinal  Level of Consciousness: awake, alert  and oriented  Airway & Oxygen Therapy: Patient Spontanous Breathing and Patient connected to face mask oxygen  Post-op Assessment: Report given to RN and Post -op Vital signs reviewed and stable  Post vital signs: Reviewed and stable  Last Vitals:  Vitals Value Taken Time  BP 121/78 11/06/19 1322  Temp    Pulse 87 11/06/19 1326  Resp 12 11/06/19 1326  SpO2 100 % 11/06/19 1326  Vitals shown include unvalidated device data.  Last Pain:  Vitals:   11/06/19 0822  TempSrc:   PainSc: 0-No pain      Patients Stated Pain Goal: 4 ([id number])  Complications: No apparent anesthesia complications

## 2019-11-06 NOTE — Anesthesia Procedure Notes (Signed)
Procedure Name: MAC Date/Time: 11/06/2019 10:39 AM Performed by: Eben Burow, CRNA Pre-anesthesia Checklist: Patient identified, Emergency Drugs available, Suction available, Patient being monitored and Timeout performed Oxygen Delivery Method: Simple face mask Dental Injury: Teeth and Oropharynx as per pre-operative assessment

## 2019-11-06 NOTE — Anesthesia Preprocedure Evaluation (Addendum)
Anesthesia Evaluation  Patient identified by MRN, date of birth, ID band Patient awake    Reviewed: Allergy & Precautions, NPO status , Patient's Chart, lab work & pertinent test results  History of Anesthesia Complications (+) PONVNegative for: history of anesthetic complications  Airway Mallampati: II  TM Distance: >3 FB Neck ROM: Full    Dental  (+) Teeth Intact   Pulmonary neg pulmonary ROS, former smoker,    Pulmonary exam normal        Cardiovascular hypertension, + Peripheral Vascular Disease (carotid stenosis s/p revascularization)  Normal cardiovascular exam     Neuro/Psych negative neurological ROS  negative psych ROS   GI/Hepatic negative GI ROS, Neg liver ROS,   Endo/Other  Hypothyroidism   Renal/GU negative Renal ROS  negative genitourinary   Musculoskeletal negative musculoskeletal ROS (+)   Abdominal   Peds  Hematology negative hematology ROS (+)   Anesthesia Other Findings  Head & neck cancer s/p chemo/XRT (2016)  Reproductive/Obstetrics                            Anesthesia Physical Anesthesia Plan  ASA: III  Anesthesia Plan: Spinal   Post-op Pain Management:    Induction:   PONV Risk Score and Plan: 2 and Propofol infusion, Treatment may vary due to age or medical condition, Ondansetron and TIVA  Airway Management Planned: Nasal Cannula and Simple Face Mask  Additional Equipment: None  Intra-op Plan:   Post-operative Plan:   Informed Consent: I have reviewed the patients History and Physical, chart, labs and discussed the procedure including the risks, benefits and alternatives for the proposed anesthesia with the patient or authorized representative who has indicated his/her understanding and acceptance.     Dental advisory given  Plan Discussed with:   Anesthesia Plan Comments:         Anesthesia Quick Evaluation

## 2019-11-06 NOTE — Anesthesia Procedure Notes (Signed)
Anesthesia Regional Block: Adductor canal block   Pre-Anesthetic Checklist: ,, timeout performed, Correct Patient, Correct Site, Correct Laterality, Correct Procedure, Correct Position, site marked, Risks and benefits discussed,  Surgical consent,  Pre-op evaluation,  At surgeon's request and post-op pain management  Laterality: Left  Prep: chloraprep       Needles:  Injection technique: Single-shot  Needle Type: Echogenic Stimulator Needle     Needle Length: 10cm  Needle Gauge: 21     Additional Needles:   Procedures:,,,, ultrasound used (permanent image in chart),,,,  Narrative:  Start time: 11/06/2019 10:15 AM End time: 11/06/2019 10:19 AM Injection made incrementally with aspirations every 5 mL.  Performed by: Personally  Anesthesiologist: Lidia Collum, MD  Additional Notes: Monitors applied. Injection made in 5cc increments. No resistance to injection. Good needle visualization. Patient tolerated procedure well.

## 2019-11-07 DIAGNOSIS — M1712 Unilateral primary osteoarthritis, left knee: Secondary | ICD-10-CM | POA: Diagnosis not present

## 2019-11-07 LAB — URINALYSIS, ROUTINE W REFLEX MICROSCOPIC
Bacteria, UA: NONE SEEN
Bilirubin Urine: NEGATIVE
Glucose, UA: NEGATIVE mg/dL
Ketones, ur: NEGATIVE mg/dL
Leukocytes,Ua: NEGATIVE
Nitrite: NEGATIVE
Protein, ur: 100 mg/dL — AB
RBC / HPF: >50 RBC/hpf — ABNORMAL HIGH (ref 0–5)
Specific Gravity, Urine: 1.024 (ref 1.005–1.030)
pH: 6 (ref 5.0–8.0)

## 2019-11-07 MED ORDER — ASPIRIN 81 MG PO CHEW: 81.0000 mg | CHEWABLE_TABLET | Freq: Two times a day (BID) | ORAL | 0 refills | Status: DC

## 2019-11-07 MED ORDER — ASPIRIN EC 325 MG PO TBEC: 325.0000 mg | DELAYED_RELEASE_TABLET | Freq: Two times a day (BID) | ORAL | 0 refills | Status: AC

## 2019-11-07 NOTE — Progress Notes (Signed)
Patient and patient wife concerned about patient voiding 50 cc of dark urine at a time. Paged PA-C Griffith Citron, PA says that "it is not a concern at this time, unless it is causing the patient pain or discomfort." notified the patient and spouse. Call bell within reach. Patient denies pain or discomfort- UA has been sent to lab.
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
## 2019-11-07 NOTE — Plan of Care (Signed)
  Problem: Education: Goal: Knowledge of the prescribed therapeutic regimen will improve Outcome: Progressing Goal: Individualized Educational Video(s) Outcome: Progressing   Problem: Activity: Goal: Ability to avoid complications of mobility impairment will improve Outcome: Progressing Goal: Range of joint motion will improve Outcome: Progressing   Problem: Clinical Measurements: Goal: Postoperative complications will be avoided or minimized Outcome: Progressing   Problem: Pain Management: Goal: Pain level will decrease with appropriate interventions Outcome: Progressing   Problem: Education: Goal: Knowledge of General Education information will improve Description: Including pain rating scale, medication(s)/side effects and non-pharmacologic comfort measures Outcome: Progressing   Problem: Health Behavior/Discharge Planning: Goal: Ability to manage health-related needs will improve Outcome: Progressing   Problem: Clinical Measurements: Goal: Ability to maintain clinical measurements within normal limits will improve Outcome: Progressing Goal: Will remain free from infection Outcome: Progressing Goal: Diagnostic test results will improve Outcome: Progressing Goal: Respiratory complications will improve Outcome: Progressing Goal: Cardiovascular complication will be avoided Outcome: Progressing   Problem: Activity: Goal: Risk for activity intolerance will decrease Outcome: Progressing   Problem: Nutrition: Goal: Adequate nutrition will be maintained Outcome: Progressing   Problem: Coping: Goal: Level of anxiety will decrease Outcome: Progressing   Problem: Elimination: Goal: Will not experience complications related to bowel motility Outcome: Progressing Goal: Will not experience complications related to urinary retention Outcome: Progressing   Problem: Pain Managment: Goal: General experience of comfort will improve Outcome: Progressing   Problem:  Safety: Goal: Ability to remain free from injury will improve Outcome: Progressing   Problem: Skin Integrity: Goal: Risk for impaired skin integrity will decrease Outcome: Progressing   
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
## 2019-11-07 NOTE — Plan of Care (Signed)

## 2019-11-07 NOTE — Plan of Care (Signed)
Problem: Education: Goal: Knowledge of the prescribed therapeutic regimen will improve 11/07/2019 1651 by Deetta Perla, RN Outcome: Adequate for Discharge 11/07/2019 [id number] by Deetta Perla, RN Outcome: Progressing Goal: Individualized Educational Video(s) 11/07/2019 1651 by Deetta Perla, RN Outcome: Adequate for Discharge 11/07/2019 0942 by Deetta Perla, RN Outcome: Progressing   Problem: Activity: Goal: Ability to avoid complications of mobility impairment will improve 11/07/2019 1651 by Deetta Perla, RN Outcome: Adequate for Discharge 11/07/2019 0942 by Deetta Perla, RN Outcome: Progressing Goal: Range of joint motion will improve 11/07/2019 1651 by Deetta Perla, RN Outcome: Adequate for Discharge 11/07/2019 0942 by Deetta Perla, RN Outcome: Progressing   Problem: Clinical Measurements: Goal: Postoperative complications will be avoided or minimized 11/07/2019 1651 by Deetta Perla, RN Outcome: Adequate for Discharge 11/07/2019 0942 by Deetta Perla, RN Outcome: Progressing   Problem: Pain Management: Goal: Pain level will decrease with appropriate interventions 11/07/2019 1651 by Deetta Perla, RN Outcome: Adequate for Discharge 11/07/2019 0942 by Deetta Perla, RN Outcome: Progressing   Problem: Skin Integrity: Goal: Will show signs of wound healing 11/07/2019 1651 by Deetta Perla, RN Outcome: Adequate for Discharge 11/07/2019 0942 by Deetta Perla, RN Outcome: Progressing   Problem: Education: Goal: Knowledge of General Education information will improve Description: Including pain rating scale, medication(s)/side effects and non-pharmacologic comfort measures 11/07/2019 1651 by Deetta Perla, RN Outcome: Adequate for Discharge 11/07/2019 0942 by Deetta Perla, RN Outcome: Progressing   Problem: Health Behavior/Discharge Planning: Goal: Ability to manage health-related needs will improve 11/07/2019 1651  by Deetta Perla, RN Outcome: Adequate for Discharge 11/07/2019 0942 by Deetta Perla, RN Outcome: Progressing   Problem: Clinical Measurements: Goal: Ability to maintain clinical measurements within normal limits will improve 11/07/2019 1651 by Deetta Perla, RN Outcome: Adequate for Discharge 11/07/2019 [id number] by Deetta Perla, RN Outcome: Progressing Goal: Will remain free from infection 11/07/2019 1651 by Deetta Perla, RN Outcome: Adequate for Discharge 11/07/2019 [id number] by Deetta Perla, RN Outcome: Progressing Goal: Diagnostic test results will improve 11/07/2019 1651 by Deetta Perla, RN Outcome: Adequate for Discharge 11/07/2019 [id number] by Deetta Perla, RN Outcome: Progressing Goal: Respiratory complications will improve 11/07/2019 1651 by Deetta Perla, RN Outcome: Adequate for Discharge 11/07/2019 [id number] by Deetta Perla, RN Outcome: Progressing Goal: Cardiovascular complication will be avoided 11/07/2019 1651 by Deetta Perla, RN Outcome: Adequate for Discharge 11/07/2019 [id number] by Deetta Perla, RN Outcome: Progressing   Problem: Activity: Goal: Risk for activity intolerance will decrease 11/07/2019 1651 by Deetta Perla, RN Outcome: Adequate for Discharge 11/07/2019 0942 by Deetta Perla, RN Outcome: Progressing   Problem: Nutrition: Goal: Adequate nutrition will be maintained 11/07/2019 1651 by Deetta Perla, RN Outcome: Adequate for Discharge 11/07/2019 0942 by Deetta Perla, RN Outcome: Progressing   Problem: Coping: Goal: Level of anxiety will decrease 11/07/2019 1651 by Deetta Perla, RN Outcome: Adequate for Discharge 11/07/2019 0942 by Deetta Perla, RN Outcome: Progressing   Problem: Elimination: Goal: Will not experience complications related to bowel motility 11/07/2019 1651 by Deetta Perla, RN Outcome: Adequate for Discharge 11/07/2019 0942 by Deetta Perla, RN Outcome:  Progressing Goal: Will not experience complications related to urinary retention 11/07/2019 1651 by Deetta Perla, RN Outcome: Adequate for Discharge 11/07/2019 0942 by Deetta Perla, RN Outcome: Progressing   Problem: Pain Managment: Goal: General experience of comfort will improve 11/07/2019 1651 by  Rufina Kimery, Helane Gunther, RN Outcome: Adequate for Discharge 11/07/2019 [id number] by Deetta Perla, RN Outcome: Progressing   Problem: Safety: Goal: Ability to remain free from injury will improve 11/07/2019 1651 by Deetta Perla, RN Outcome: Adequate for Discharge 11/07/2019 0942 by Deetta Perla, RN Outcome: Progressing   Problem: Skin Integrity: Goal: Risk for impaired skin integrity will decrease 11/07/2019 1651 by Deetta Perla, RN Outcome: Adequate for Discharge 11/07/2019 0942 by Deetta Perla, RN Outcome: Progressing

## 2019-11-09 NOTE — Discharge Summary (Signed)
Physician Discharge Summary  Patient ID: Terry Holt MRN: [id number] DOB/AGE: 1956/08/19 64 y.o.  Admit date: 11/06/2019 Discharge date: 11/09/2019  Admission Diagnoses: Left knee OA  Discharge Diagnoses:  Active Problems:   Osteoarthritis of left knee   Discharged Condition: good  Hospital Course: patient was admitted on March 19 for a left total knee arthroplasty due to end-stage left  Knee osteoarthritis.  He did well during surgery.  He was sent to PACU in stable condition.  He was sent up to the postop floor in stable condition with no complaints.  He does have some difficulty voiding the first night.  He had tohave 2 separate straight cath placed.  Postop day 1 doing well, pain is well controlled.  Hemovac drain was removed.  He worked with physical therapy both the night previously and this morning.  He was complaining of some dark urine on voiding so urinalysis was ordered.  Patient was discharged in stable condition and will follow up in the office.  He was sent home with pain medication, nausea medication and a muscle relaxer.  Consults: None  Significant Diagnostic Studies: labs:  Urinalysis    Component Value Date/Time   COLORURINE RED (A) 11/07/2019 0944   APPEARANCEUR CLOUDY (A) 11/07/2019 0944   LABSPEC 1.024 11/07/2019 0944   PHURINE 6.0 11/07/2019 0944   GLUCOSEU NEGATIVE 11/07/2019 0944   HGBUR LARGE (A) 11/07/2019 0944   HGBUR negative 08/23/2010 0940   BILIRUBINUR NEGATIVE 11/07/2019 0944   BILIRUBINUR n 04/09/2019 1050   KETONESUR NEGATIVE 11/07/2019 0944   PROTEINUR 100 (A) 11/07/2019 0944   UROBILINOGEN 0.2 04/09/2019 1050   UROBILINOGEN 0.2 08/23/2010 0940   NITRITE NEGATIVE 11/07/2019 0944   LEUKOCYTESUR NEGATIVE 11/07/2019 0944     Treatments: IV hydration, antibiotics: Ancef, analgesia: acetaminophen w/ codeine, therapies: PT and surgery: Left total knee arthroplasty  Discharge Exam: Blood pressure (!) 146/83, pulse 96, temperature 98.4 F  (36.9 C), temperature source Oral, resp. rate 18, height 6\' 1"  (1.854 m), weight 92.5 kg, SpO2 100 %. General appearance: alert, cooperative, appears stated age and no distress Extremities: extremities normal, atraumatic, no cyanosis or edema and Homans sign is negative, no sign of DVT Pulses: 2+ and symmetric Skin: Skin color, texture, turgor normal. No rashes or lesions Neurologic: Alert and oriented X 3, normal strength and tone. Normal symmetric reflexes. Normal coordination and gait Incision/Wound: dressings clean dry and intact  Disposition: Discharge disposition: 01-Home or Self Care       Discharge Instructions    Call MD / Call 911   Complete by: As directed    If you experience chest pain or shortness of breath, CALL 911 and be transported to the hospital emergency room.  If you develope a fever above 101 F, pus (white drainage) or increased drainage or redness at the wound, or calf pain, call your surgeon's office.   Constipation Prevention   Complete by: As directed    Drink plenty of fluids.  Prune juice may be helpful.  You may use a stool softener, such as Colace (over the counter) 100 mg twice a day.  Use MiraLax (over the counter) for constipation as needed.   Diet - low sodium heart healthy   Complete by: As directed    Discharge instructions   Complete by: As directed    Dr. [name] [facility] [address] [place], [place] [address] [phone]  TOTAL KNEE REPLACEMENT POSTOPERATIVE DIRECTIONS  Knee Rehabilitation, Guidelines Following Surgery  Results  after knee surgery are often greatly improved when you follow the exercise, range of motion and muscle strengthening exercises prescribed by your doctor. Safety measures are also important to protect the knee from further injury. Any time any of these exercises cause you to have increased pain or swelling in your knee joint, decrease the amount until you are comfortable again and slowly  increase them. If you have problems or questions, call your caregiver or physical therapist for advice.   HOME CARE INSTRUCTIONS  Remove items at home which could result in a fall. This includes throw rugs or furniture in walking pathways.  ICE to the affected knee every three hours for 30 minutes at a time and then as needed for pain and swelling.  Continue to use ice on the knee for pain and swelling from surgery. You may notice swelling that will progress down to the foot and ankle.  This is normal after surgery.  Elevate the leg when you are not up walking on it.   Continue to use the breathing machine which will help keep your temperature down.  It is common for your temperature to cycle up and down following surgery, especially at night when you are not up moving around and exerting yourself.  The breathing machine keeps your lungs expanded and your temperature down. Do not place pillow under knee, focus on keeping the knee straight while resting   DIET You may resume your previous home diet once your are discharged from the hospital.  DRESSING / WOUND CARE / SHOWERING Keep the surgical dressing until follow up.  The dressing is water proof, but you need to put extra covering over it like plastic wrap.  IF THE DRESSING FALLS OFF or the wound gets wet inside, change the dressing with sterile gauze.  Please use good hand washing techniques before changing the dressing.  Do not use any lotions or creams on the incision until instructed by your surgeon.   You may start showering once you are discharged home but do not submerge the incision under water.  You are sent home with an ACE bandage on over the leg, this can be removed at 3 days from surgery. At this time you can start showering. Please place the white TED stocking on the surgical leg after. This needs to be worn on the surgical leg for 2 weeks after surgery.   ACTIVITY Walk with your walker as instructed. Use walker as long as suggested  by your caregivers. Avoid periods of inactivity such as sitting longer than an hour when not asleep. This helps prevent blood clots.  You may resume a sexual relationship in one month or when given the OK by your doctor.  You may return to work once you are cleared by your doctor.  Do not drive a car for 6 weeks or until released by you surgeon.  Do not drive while taking narcotics.  WEIGHT BEARING Weight bearing as tolerated with assist device (walker, cane, etc) as directed, use it as long as suggested by your surgeon or therapist, typically at least 4-6 weeks.  POSTOPERATIVE CONSTIPATION PROTOCOL Constipation - defined medically as fewer than three stools per week and severe constipation as less than one stool per week.  One of the most common issues patients have following surgery is constipation.  Even if you have a regular bowel pattern at home, your normal regimen is likely to be disrupted due to multiple reasons following surgery.  Combination of anesthesia, postoperative narcotics, change  in appetite and fluid intake all can affect your bowels.  In order to avoid complications following surgery, here are some recommendations in order to help you during your recovery period.  Colace (docusate) - Pick up an over-the-counter form of Colace or another stool softener and take twice a day as long as you are requiring postoperative pain medications.  Take with a full glass of water daily.  If you experience loose stools or diarrhea, hold the colace until you stool forms back up.  If your symptoms do not get better within 1 week or if they get worse, check with your doctor.  Dulcolax (bisacodyl) - Pick up over-the-counter and take as directed by the product packaging as needed to assist with the movement of your bowels.  Take with a full glass of water.  Use this product as needed if not relieved by Colace only.   MiraLax (polyethylene glycol) - Pick up over-the-counter to have on hand.  MiraLax  is a solution that will increase the amount of water in your bowels to assist with bowel movements.  Take as directed and can mix with a glass of water, juice, soda, coffee, or tea.  Take if you go more than two days without a movement. Do not use MiraLax more than once per day. Call your doctor if you are still constipated or irregular after using this medication for 7 days in a row.  If you continue to have problems with postoperative constipation, please contact the office for further assistance and recommendations.  If you experience "the worst abdominal pain ever" or develop nausea or vomiting, please contact the office immediatly for further recommendations for treatment.  ITCHING  If you experience itching with your medications, try taking only a single pain pill, or even half a pain pill at a time.  You can also use Benadryl over the counter for itching or also to help with sleep.   TED HOSE STOCKINGS Wear the elastic stockings on both legs for two weeks following surgery during the day but you may remove then at night for sleeping.  Okay to remove ACE in 3 days, put TED on after  MEDICATIONS See your medication summary on the "After Visit Summary" that the nursing staff will review with you prior to discharge.  You may have some home medications which will be placed on hold until you complete the course of blood thinner medication.  It is important for you to complete the blood thinner medication as prescribed by your surgeon.  Continue your approved medications as instructed at time of discharge. If you were not previously taking any blood thinners prior to surgery please start taking Aspirin 325 mg tabs twice daily for 6 weeks. If you are unable to take Aspirin please let your doctor know.   PRECAUTIONS If you experience chest pain or shortness of breath - call 911 immediately for transfer to the hospital emergency department.  If you develop a fever greater that 101 F, purulent  drainage from wound, increased redness or drainage from wound, foul odor from the wound/dressing, or calf pain - CONTACT YOUR SURGEON.                                                   FOLLOW-UP APPOINTMENTS Make sure you keep all of your appointments after your operation with your surgeon  and caregivers. You should call the office at the above phone number and make an appointment for approximately two weeks after the date of your surgery or on the date instructed by your surgeon outlined in the "After Visit Summary".   RANGE OF MOTION AND STRENGTHENING EXERCISES  Rehabilitation of the knee is important following a knee injury or an operation. After just a few days of immobilization, the muscles of the thigh which control the knee become weakened and shrink (atrophy). Knee exercises are designed to build up the tone and strength of the thigh muscles and to improve knee motion. Often times heat used for twenty to thirty minutes before working out will loosen up your tissues and help with improving the range of motion but do not use heat for the first two weeks following surgery. These exercises can be done on a training (exercise) mat, on the floor, on a table or on a bed. Use what ever works the best and is most comfortable for you Knee exercises include:  Leg Lifts - While your knee is still immobilized in a splint or cast, you can do straight leg raises. Lift the leg to 60 degrees, hold for 3 sec, and slowly lower the leg. Repeat 10-20 times 2-3 times daily. Perform this exercise against resistance later as your knee gets better.  Quad and Hamstring Sets - Tighten up the muscle on the front of the thigh (Quad) and hold for 5-10 sec. Repeat this 10-20 times hourly. Hamstring sets are done by pushing the foot backward against an object and holding for 5-10 sec. Repeat as with quad sets.  Leg Slides: Lying on your back, slowly slide your foot toward your buttocks, bending your knee up off the floor (only go  as far as is comfortable). Then slowly slide your foot back down until your leg is flat on the floor again. Angel Wings: Lying on your back spread your legs to the side as far apart as you can without causing discomfort.  A rehabilitation program following serious knee injuries can speed recovery and prevent re-injury in the future due to weakened muscles. Contact your doctor or a physical therapist for more information on knee rehabilitation.   IF YOU ARE TRANSFERRED TO A SKILLED REHAB FACILITY If the patient is transferred to a skilled rehab facility following release from the hospital, a list of the current medications will be sent to the facility for the patient to continue.  When discharged from the skilled rehab facility, please have the facility set up the patient's [place] prior to being released. Also, the skilled facility will be responsible for providing the patient with their medications at time of release from the facility to include their pain medication, the muscle relaxants, and their blood thinner medication. If the patient is still at the rehab facility at time of the two week follow up appointment, the skilled rehab facility will also need to assist the patient in arranging follow up appointment in our office and any transportation needs.  MAKE SURE YOU:  Understand these instructions.  Get help right away if you are not doing well or get worse.    Pick up stool softner and laxative for home use following surgery while on pain medications. May shower starting three days after surgery. Please use a clean towel to pat the leg dry following showers. Continue to use ice for pain and swelling after surgery. Do not use any lotions or creams on the incision until instructed  by you Start Aspirin immediately following surgery.   Do not put a pillow under the knee. Place it under the heel.   Complete by: As directed    Driving restrictions   Complete by: As  directed    No driving for two weeks   TED hose   Complete by: As directed    Use stockings (TED hose) for three weeks on both leg(s).  You may remove them at night for sleeping.   Weight bearing as tolerated   Complete by: As directed      Allergies as of 11/07/2019      Reactions   Valsartan Rash      Medication List    TAKE these medications   amLODipine 5 MG tablet Commonly known as: NORVASC Take 1 tablet (5 mg total) by mouth daily.   ascorbic acid 500 MG tablet Commonly known as: VITAMIN C Take 500 mg by mouth daily.   aspirin EC 325 MG tablet Take 1 tablet (325 mg total) by mouth in the morning and at bedtime for 28 days. What changed:   medication strength  how much to take  when to take this   atorvastatin 20 MG tablet Commonly known as: LIPITOR Take 1 tablet (20 mg total) by mouth daily.   atorvastatin 10 MG tablet Commonly known as: LIPITOR Take 10 mg by mouth daily.   levothyroxine 25 MCG tablet Commonly known as: SYNTHROID Take 1 tablet (25 mcg total) by mouth daily. What changed: when to take this   lisinopril 10 MG tablet Commonly known as: ZESTRIL TAKE 1 TABLET BY MOUTH EVERY DAY   methocarbamol 500 MG tablet Commonly known as: Robaxin Take 1 tablet (500 mg total) by mouth 4 (four) times daily.   ondansetron 4 MG tablet Commonly known as: Zofran Take 1 tablet (4 mg total) by mouth daily as needed for nausea or vomiting.   oxyCODONE 5 MG immediate release tablet Commonly known as: Roxicodone Take 1 tablet (5 mg total) by mouth every 4 (four) hours as needed.            Discharge Care Instructions  (From admission, onward)         Start     Ordered   11/06/19 0000  Weight bearing as tolerated     11/06/19 1022           Signed: Drue Novel 11/09/2019, 7:55 AM

## 2019-11-10 ENCOUNTER — Other Ambulatory Visit: Payer: Self-pay | Admitting: *Deleted

## 2019-11-10 DIAGNOSIS — I6529 Occlusion and stenosis of unspecified carotid artery: Secondary | ICD-10-CM

## 2019-11-11 ENCOUNTER — Other Ambulatory Visit: Payer: Self-pay

## 2019-11-11 ENCOUNTER — Ambulatory Visit (INDEPENDENT_AMBULATORY_CARE_PROVIDER_SITE_OTHER): Payer: 59 | Admitting: Physician Assistant

## 2019-11-11 ENCOUNTER — Ambulatory Visit (HOSPITAL_COMMUNITY)
Admission: RE | Admit: 2019-11-11 | Discharge: 2019-11-11 | Disposition: A | Discharge status: Home / Self Care | Payer: 59 | Source: Ambulatory Visit | Attending: Vascular Surgery | Admitting: Vascular Surgery

## 2019-11-11 VITALS — BP 124/74 | HR 92 | Temp 98.3°F | Resp 16 | Ht 73.0 in | Wt 208.9 lb

## 2019-11-11 DIAGNOSIS — I6529 Occlusion and stenosis of unspecified carotid artery: Secondary | ICD-10-CM | POA: Diagnosis not present
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
## 2019-11-12 ENCOUNTER — Other Ambulatory Visit: Payer: Self-pay | Admitting: *Deleted

## 2019-11-12 DIAGNOSIS — I6529 Occlusion and stenosis of unspecified carotid artery: Secondary | ICD-10-CM

## 2019-11-17 ENCOUNTER — Encounter: Payer: Self-pay | Admitting: *Deleted

## 2019-12-10 ENCOUNTER — Encounter: Payer: Self-pay | Admitting: Family Medicine

## 2019-12-10 MED ORDER — LEVOTHYROXINE SODIUM 25 MCG PO TABS: 25.0000 ug | ORAL_TABLET | Freq: Every day | ORAL | 0 refills | Status: DC

## 2019-12-14 ENCOUNTER — Other Ambulatory Visit: Payer: Self-pay | Admitting: Adult Health

## 2019-12-30 ENCOUNTER — Other Ambulatory Visit: Payer: Self-pay

## 2019-12-30 MED ORDER — AMLODIPINE BESYLATE 5 MG PO TABS: 5.0000 mg | ORAL_TABLET | Freq: Every day | ORAL | 3 refills | Status: DC

## 2020-01-06 ENCOUNTER — Other Ambulatory Visit: Payer: Self-pay | Admitting: *Deleted

## 2020-01-06 MED ORDER — AMLODIPINE BESYLATE 5 MG PO TABS: 5.0000 mg | ORAL_TABLET | Freq: Every day | ORAL | 3 refills | Status: DC

## 2020-01-30 ENCOUNTER — Other Ambulatory Visit: Payer: Self-pay | Admitting: Family Medicine

## 2020-03-30 ENCOUNTER — Encounter: Payer: Self-pay | Admitting: Family Medicine

## 2020-03-30 DIAGNOSIS — B977 Papillomavirus as the cause of diseases classified elsewhere: Secondary | ICD-10-CM

## 2020-03-30 NOTE — Telephone Encounter (Signed)
I hand wrote an order for [facility], this is in your folder

## 2020-04-14 ENCOUNTER — Other Ambulatory Visit: Payer: 59

## 2020-04-14 ENCOUNTER — Telehealth: Payer: Self-pay | Admitting: Family Medicine

## 2020-04-14 NOTE — Telephone Encounter (Signed)
Pt call and stated he have a order to come and do a blood draw to see if he have HPV Cancer and want a call back to see what to do.

## 2020-04-15 NOTE — Telephone Encounter (Signed)
As noted in the telephone message dated 03-31-20 I hand wrote this order and placed it in yout folder for the patient to come pick it up

## 2020-04-15 NOTE — Telephone Encounter (Signed)
Spoke with the Patient he picked up this order. He simply wanted recommendations on where to go to have these labs done. I suggested the patient check with a [facility].

## 2020-04-18 ENCOUNTER — Encounter: Payer: Self-pay | Admitting: Family Medicine

## 2020-04-26 NOTE — Telephone Encounter (Signed)
I wrote another order for the [facility]. It is in your folder

## 2020-04-29 ENCOUNTER — Telehealth: Payer: Self-pay | Admitting: Family Medicine

## 2020-04-29 NOTE — Telephone Encounter (Signed)
error 

## 2020-05-05 ENCOUNTER — Encounter: Payer: Self-pay | Admitting: Family Medicine

## 2020-05-05 ENCOUNTER — Ambulatory Visit (INDEPENDENT_AMBULATORY_CARE_PROVIDER_SITE_OTHER): Payer: 59 | Admitting: Family Medicine

## 2020-05-05 ENCOUNTER — Other Ambulatory Visit: Payer: Self-pay

## 2020-05-05 VITALS — BP 124/70 | HR 75 | Temp 97.8°F | Ht 73.0 in | Wt 202.4 lb

## 2020-05-05 DIAGNOSIS — N410 Acute prostatitis: Secondary | ICD-10-CM

## 2020-05-05 MED ORDER — DOXYCYCLINE HYCLATE 100 MG PO TABS: 100.0000 mg | ORAL_TABLET | Freq: Two times a day (BID) | ORAL | 0 refills | Status: DC

## 2020-05-05 NOTE — Progress Notes (Signed)
° °  Subjective:    Patient ID: Terry Holt, male    DOB: 1956/02/23, 64 y.o.   MRN: [id number]  HPI Here for several weeks of increased urgency to urinate, a slow stream ,and mild low back pain. No burning or fever.    Review of Systems  Constitutional: Negative.   Respiratory: Negative.   Cardiovascular: Negative.   Gastrointestinal: Negative.   Genitourinary: Positive for difficulty urinating and urgency. Negative for dysuria, flank pain and hematuria.       Objective:   Physical Exam Constitutional:      Appearance: Normal appearance.  Cardiovascular:     Rate and Rhythm: Normal rate and regular rhythm.     Pulses: Normal pulses.     Heart sounds: Normal heart sounds.  Pulmonary:     Effort: Pulmonary effort is normal.     Breath sounds: Normal breath sounds.  Neurological:     Mental Status: He is alert.           Assessment & Plan:  Prostatitis, treat with 30 days of Doxycycline.  Alysia Penna, MD

## 2020-05-17 ENCOUNTER — Encounter: Payer: Self-pay | Admitting: Family Medicine

## 2020-05-17 ENCOUNTER — Ambulatory Visit (INDEPENDENT_AMBULATORY_CARE_PROVIDER_SITE_OTHER): Payer: 59 | Admitting: Family Medicine

## 2020-05-17 ENCOUNTER — Other Ambulatory Visit: Payer: Self-pay

## 2020-05-17 VITALS — BP 120/80 | HR 75 | Temp 97.7°F | Ht 73.0 in | Wt 200.0 lb

## 2020-05-17 DIAGNOSIS — Z0000 Encounter for general adult medical examination without abnormal findings: Secondary | ICD-10-CM

## 2020-05-17 DIAGNOSIS — E039 Hypothyroidism, unspecified: Secondary | ICD-10-CM

## 2020-05-17 NOTE — Progress Notes (Signed)
   Subjective:    Patient ID: Terry Holt, male    DOB: 1956-07-06, 64 y.o.   MRN: [id number]  HPI Here for a well exam. He feels fine. He is halfway through a 30 day course of Doxycycline for a prostate infection.    Review of Systems  Constitutional: Negative.   HENT: Negative.   Eyes: Negative.   Respiratory: Negative.   Cardiovascular: Negative.   Gastrointestinal: Negative.   Genitourinary: Negative.   Musculoskeletal: Negative.   Skin: Negative.   Neurological: Negative.   Psychiatric/Behavioral: Negative.        Objective:   Physical Exam Constitutional:      General: He is not in acute distress.    Appearance: He is well-developed. He is not diaphoretic.  HENT:     Head: Normocephalic and atraumatic.     Right Ear: External ear normal.     Left Ear: External ear normal.     Nose: Nose normal.     Mouth/Throat:     Pharynx: No oropharyngeal exudate.  Eyes:     General: No scleral icterus.       Right eye: No discharge.        Left eye: No discharge.     Conjunctiva/sclera: Conjunctivae normal.     Pupils: Pupils are equal, round, and reactive to light.  Neck:     Thyroid: No thyromegaly.     Vascular: No JVD.     Trachea: No tracheal deviation.  Cardiovascular:     Rate and Rhythm: Normal rate and regular rhythm.     Heart sounds: Normal heart sounds. No murmur heard.  No friction rub. No gallop.   Pulmonary:     Effort: Pulmonary effort is normal. No respiratory distress.     Breath sounds: Normal breath sounds. No wheezing or rales.  Chest:     Chest wall: No tenderness.  Abdominal:     General: Bowel sounds are normal. There is no distension.     Palpations: Abdomen is soft. There is no mass.     Tenderness: There is no abdominal tenderness. There is no guarding or rebound.  Genitourinary:    Penis: Normal. No tenderness.      Testes: Normal.     Prostate: Normal.     Rectum: Normal. Guaiac result negative.  Musculoskeletal:         General: No tenderness. Normal range of motion.     Cervical back: Neck supple.  Lymphadenopathy:     Cervical: No cervical adenopathy.  Skin:    General: Skin is warm and dry.     Coloration: Skin is not pale.     Findings: No erythema or rash.  Neurological:     Mental Status: He is alert and oriented to person, place, and time.     Cranial Nerves: No cranial nerve deficit.     Motor: No abnormal muscle tone.     Coordination: Coordination normal.     Deep Tendon Reflexes: Reflexes are normal and symmetric. Reflexes normal.  Psychiatric:        Behavior: Behavior normal.        Thought Content: Thought content normal.        Judgment: Judgment normal.           Assessment & Plan:  Well exam. We discussed diet and exercise. Get fasting labs. We agreed to postpone the PSA until after the prostatitis has resolved.  Alysia Penna, MD

## 2020-05-18 LAB — BASIC METABOLIC PANEL
BUN: 14 mg/dL (ref 7–25)
CO2: 26 mmol/L (ref 20–32)
Calcium: 9.9 mg/dL (ref 8.6–10.3)
Chloride: 95 mmol/L — ABNORMAL LOW (ref 98–110)
Creat: 0.91 mg/dL (ref 0.70–1.25)
Glucose, Bld: 77 mg/dL (ref 65–99)
Potassium: 3.9 mmol/L (ref 3.5–5.3)
Sodium: 134 mmol/L — ABNORMAL LOW (ref 135–146)

## 2020-05-18 LAB — HEPATIC FUNCTION PANEL
AG Ratio: 2 (calc) (ref 1.0–2.5)
ALT: 16 U/L (ref 9–46)
AST: 23 U/L (ref 10–35)
Albumin: 4.6 g/dL (ref 3.6–5.1)
Alkaline phosphatase (APISO): 55 U/L (ref 35–144)
Bilirubin, Direct: 0.2 mg/dL (ref 0.0–0.2)
Globulin: 2.3 g/dL (calc) (ref 1.9–3.7)
Indirect Bilirubin: 0.8 mg/dL (calc) (ref 0.2–1.2)
Total Bilirubin: 1 mg/dL (ref 0.2–1.2)
Total Protein: 6.9 g/dL (ref 6.1–8.1)

## 2020-05-18 LAB — CBC WITH DIFFERENTIAL/PLATELET
Absolute Monocytes: 838 cells/uL (ref 200–950)
Basophils Absolute: 38 cells/uL (ref 0–200)
Basophils Relative: 0.6 %
Eosinophils Absolute: 139 cells/uL (ref 15–500)
Eosinophils Relative: 2.2 %
HCT: 44.2 % (ref 38.5–50.0)
Hemoglobin: 15.4 g/dL (ref 13.2–17.1)
Lymphs Abs: 1166 cells/uL (ref 850–3900)
MCH: 32.9 pg (ref 27.0–33.0)
MCHC: 34.8 g/dL (ref 32.0–36.0)
MCV: 94.4 fL (ref 80.0–100.0)
MPV: 9.2 fL (ref 7.5–12.5)
Monocytes Relative: 13.3 %
Neutro Abs: 4120 cells/uL (ref 1500–7800)
Neutrophils Relative %: 65.4 %
Platelets: 215 10*3/uL (ref 140–400)
RBC: 4.68 10*6/uL (ref 4.20–5.80)
RDW: 12.9 % (ref 11.0–15.0)
Total Lymphocyte: 18.5 %
WBC: 6.3 10*3/uL (ref 3.8–10.8)

## 2020-05-18 LAB — LIPID PANEL
Cholesterol: 123 mg/dL (ref <200)
HDL: 61 mg/dL (ref >=40)
LDL Cholesterol (Calc): 49 mg/dL (calc)
Non-HDL Cholesterol (Calc): 62 mg/dL (calc) (ref <130)
Total CHOL/HDL Ratio: 2 (calc) (ref <5.0)
Triglycerides: 57 mg/dL (ref <150)

## 2020-05-18 LAB — URINALYSIS
Bilirubin Urine: NEGATIVE
Glucose, UA: NEGATIVE
Hgb urine dipstick: NEGATIVE
Leukocytes,Ua: NEGATIVE
Nitrite: NEGATIVE
Specific Gravity, Urine: 1.028 (ref 1.001–1.03)
pH: 6 (ref 5.0–8.0)

## 2020-05-18 LAB — TSH: TSH: 4.01 mIU/L (ref 0.40–4.50)

## 2020-05-18 LAB — T3, FREE: T3, Free: 3.6 pg/mL (ref 2.3–4.2)

## 2020-05-18 LAB — T4, FREE: Free T4: 1.3 ng/dL (ref 0.8–1.8)

## 2020-05-30 ENCOUNTER — Telehealth: Payer: Self-pay

## 2020-05-30 NOTE — Telephone Encounter (Signed)
Patient called in reference to a copy of [facility] Test Result that was sent to Dr. Sarajane Jews, advised Dr. Sarajane Jews would like a copy sent to the oncologist. The patient says he has a copy and will notify his oncologist of the results.

## 2020-06-01 ENCOUNTER — Encounter: Payer: Self-pay | Admitting: Family Medicine

## 2020-06-02 NOTE — Telephone Encounter (Signed)
I understand. Have him get in touch with [place] again in 3 months

## 2020-06-28 ENCOUNTER — Other Ambulatory Visit: Payer: Self-pay

## 2020-06-28 ENCOUNTER — Encounter: Payer: Self-pay | Admitting: Family Medicine

## 2020-06-28 DIAGNOSIS — I10 Essential (primary) hypertension: Secondary | ICD-10-CM

## 2020-06-28 DIAGNOSIS — I6521 Occlusion and stenosis of right carotid artery: Secondary | ICD-10-CM

## 2020-06-28 MED ORDER — ATORVASTATIN CALCIUM 10 MG PO TABS
10.0000 mg | ORAL_TABLET | Freq: Every day | ORAL | 3 refills | Status: DC
Start: 2020-06-28 — End: 2020-06-28

## 2020-06-28 MED ORDER — ATORVASTATIN CALCIUM 10 MG PO TABS
10.0000 mg | ORAL_TABLET | Freq: Every day | ORAL | 0 refills | Status: DC
Start: 2020-06-28 — End: 2020-07-25

## 2020-06-28 MED ORDER — LISINOPRIL 10 MG PO TABS: 10.0000 mg | ORAL_TABLET | Freq: Every day | ORAL | 1 refills | Status: DC

## 2020-07-07 ENCOUNTER — Ambulatory Visit (HOSPITAL_COMMUNITY)
Admission: RE | Admit: 2020-07-07 | Discharge: 2020-07-07 | Disposition: A | Discharge status: Home / Self Care | Payer: 59 | Source: Ambulatory Visit | Attending: Physician Assistant | Admitting: Physician Assistant

## 2020-07-07 ENCOUNTER — Other Ambulatory Visit: Payer: Self-pay

## 2020-07-07 ENCOUNTER — Ambulatory Visit (INDEPENDENT_AMBULATORY_CARE_PROVIDER_SITE_OTHER): Payer: 59 | Admitting: Physician Assistant

## 2020-07-07 VITALS — BP 118/79 | HR 75 | Temp 98.5°F | Resp 20 | Ht 73.0 in | Wt 203.7 lb

## 2020-07-07 DIAGNOSIS — I6521 Occlusion and stenosis of right carotid artery: Secondary | ICD-10-CM

## 2020-07-07 DIAGNOSIS — I6523 Occlusion and stenosis of bilateral carotid arteries: Secondary | ICD-10-CM
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
## 2020-07-20 ENCOUNTER — Encounter: Payer: Self-pay | Admitting: Family Medicine

## 2020-07-21 NOTE — Telephone Encounter (Signed)
The form is ready  

## 2020-07-25 MED ORDER — ATORVASTATIN CALCIUM 20 MG PO TABS: 20.0000 mg | ORAL_TABLET | Freq: Every day | ORAL | 2 refills | Status: DC

## 2020-07-25 NOTE — Telephone Encounter (Signed)
Please confirm with [facility] that the 20 mg rx will replace the 10 mg one

## 2020-08-03 ENCOUNTER — Ambulatory Visit (INDEPENDENT_AMBULATORY_CARE_PROVIDER_SITE_OTHER): Payer: 59 | Admitting: Cardiology

## 2020-08-03 ENCOUNTER — Encounter: Payer: Self-pay | Admitting: Cardiology

## 2020-08-03 ENCOUNTER — Other Ambulatory Visit: Payer: Self-pay

## 2020-08-03 VITALS — BP 134/74 | HR 77 | Ht 73.0 in | Wt 196.8 lb

## 2020-08-03 DIAGNOSIS — I6521 Occlusion and stenosis of right carotid artery: Secondary | ICD-10-CM | POA: Diagnosis not present

## 2020-08-03 DIAGNOSIS — E785 Hyperlipidemia, unspecified: Secondary | ICD-10-CM | POA: Diagnosis not present

## 2020-08-03 DIAGNOSIS — I10 Essential (primary) hypertension: Secondary | ICD-10-CM | POA: Diagnosis not present

## 2020-08-03 DIAGNOSIS — C01 Malignant neoplasm of base of tongue: Secondary | ICD-10-CM

## 2020-08-03 MED ORDER — ATORVASTATIN CALCIUM 20 MG PO TABS
20.0000 mg | ORAL_TABLET | Freq: Every day | ORAL | 3 refills | Status: DC
Start: 2020-08-03 — End: 2021-08-01

## 2020-08-03 MED ORDER — LISINOPRIL 10 MG PO TABS: 10.0000 mg | ORAL_TABLET | Freq: Every day | ORAL | 3 refills | Status: DC

## 2020-08-03 MED ORDER — AMLODIPINE BESYLATE 5 MG PO TABS
5.0000 mg | ORAL_TABLET | Freq: Every day | ORAL | 3 refills | Status: DC
Start: 2020-08-03 — End: 2021-07-19

## 2020-08-03 NOTE — Assessment & Plan Note (Signed)
LDL 49 on statin Rx Sept 2021

## 2020-08-03 NOTE — Assessment & Plan Note (Signed)
Followed at [facility] 

## 2020-08-03 NOTE — Assessment & Plan Note (Signed)
Controlled.  

## 2020-08-03 NOTE — Patient Instructions (Signed)
Medication Instructions:  Continue current medications  *If you need a refill on your cardiac medications before your next appointment, please call your pharmacy*   Lab Work: None Ordered   Testing/Procedures: None Ordered   Follow-Up: At [facility], you and your health needs are our priority.  As part of our continuing mission to provide you with exceptional heart care, we have created designated Provider Care Teams.  These Care Teams include your primary Cardiologist (physician) and Advanced Practice Providers (APPs -  Physician Assistants and Nurse Practitioners) who all work together to provide you with the care you need, when you need it.  We recommend signing up for the patient portal called "MyChart".  Sign up information is provided on this After Visit Summary.  MyChart is used to connect with patients for Virtual Visits (Telemedicine).  Patients are able to view lab/test results, encounter notes, upcoming appointments, etc.  Non-urgent messages can be sent to your provider as well.   To learn more about what you can do with MyChart, go to [email or web address].    Your next appointment:   1 year(s)  The format for your next appointment:   In Person  Provider:   You may see Mihai Croitoru, MD or one of the following Advanced Practice Providers on your designated Care Team:    Hao Meng, PA-C  Angela Duke, PA-C or   Krista Kroeger, PA-C    
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
## 2020-08-18 ENCOUNTER — Telehealth: Payer: Self-pay

## 2020-08-18 NOTE — Telephone Encounter (Signed)
Spoke with Lelon Mast with [facility] regarding need for additional information.  Information given.  Patient aware as well.

## 2020-09-07 ENCOUNTER — Encounter: Payer: Self-pay | Admitting: Family Medicine

## 2020-09-07 MED ORDER — LEVOTHYROXINE SODIUM 50 MCG PO TABS: 50.0000 ug | ORAL_TABLET | Freq: Every day | ORAL | 3 refills | Status: DC

## 2020-09-07 NOTE — Telephone Encounter (Signed)
Done

## 2020-09-09 ENCOUNTER — Encounter: Payer: Self-pay | Admitting: Family Medicine

## 2020-09-09 MED ORDER — DOXYCYCLINE HYCLATE 100 MG PO TABS: 100.0000 mg | ORAL_TABLET | Freq: Two times a day (BID) | ORAL | 0 refills | Status: DC

## 2020-09-09 NOTE — Telephone Encounter (Signed)
Call in Doxycycline 100 mg BID #60 with no refills

## 2021-03-14 IMAGING — CT CT ANGIOGRAPHY NECK
3 of 7 series · 10 of 35 positions shown · IV contrast (iopamidol)
Comparison: None.

CLINICAL DATA: Right carotid stenosis

EXAM:
CT ANGIOGRAPHY NECK
TECHNIQUE: Multidetector CT imaging of the neck was performed using the
standard protocol during bolus administration of intravenous
contrast. Multiplanar CT image reconstructions and MIPs were
obtained to evaluate the vascular anatomy. Carotid stenosis
measurements (when applicable) are obtained utilizing NASCET
criteria, using the distal internal carotid diameter as the
denominator.
CONTRAST:  75mL A51TPG-3LL IOPAMIDOL (A51TPG-3LL) INJECTION 76%
Creatinine was obtained on site at [HOSPITAL] at [REDACTED].
Results: Creatinine 0.8 mg/dL.

[Series 6: cta neck 2.00 bv36 s3 cta neck (person_name) · axial · 0.50mm/px · z∈[-753,-649]mm · 2 of 156 slices shown]
[im 52/156  soft-tissue]
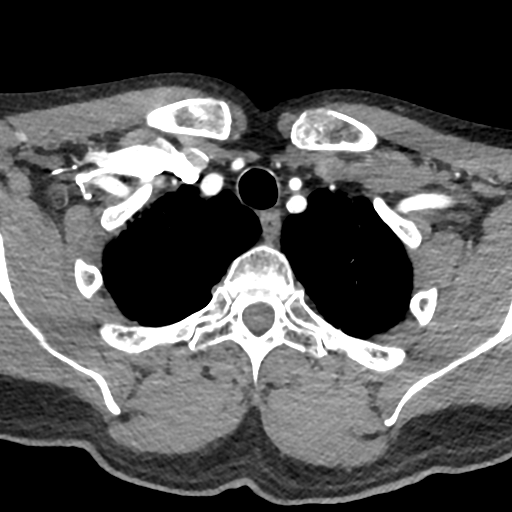
[im 104/156  soft-tissue]
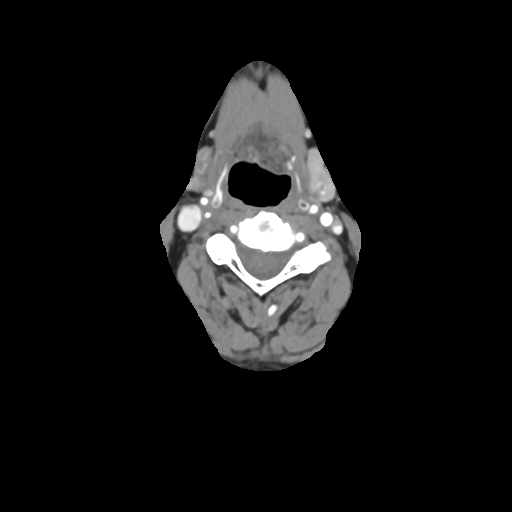

[Series 7: cta neck 1.00 bv48 s3 ax thin mips · axial · 0.50mm/px · z∈[-798,-590]mm · 5 of 313 slices shown]
[im 53/313  soft-tissue]
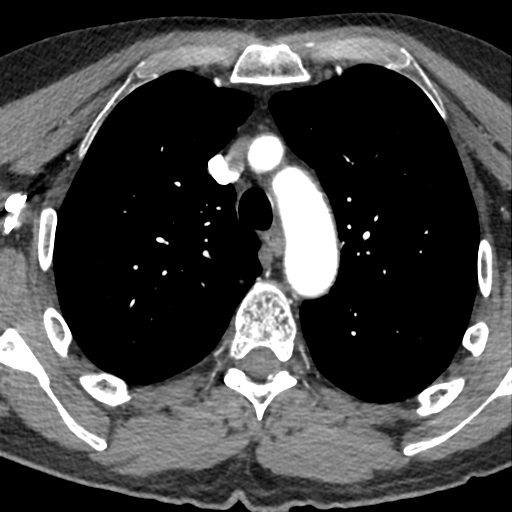
[im 105/313  bone]
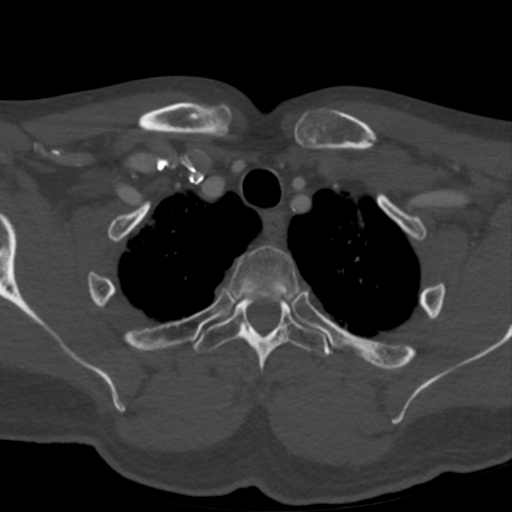
[im 157/313  soft-tissue]
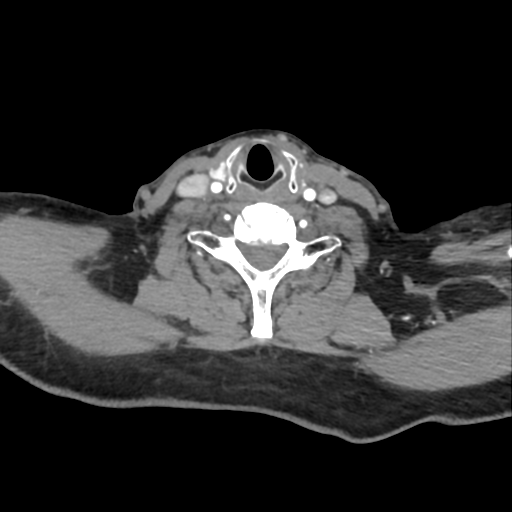
[im 209/313  bone]
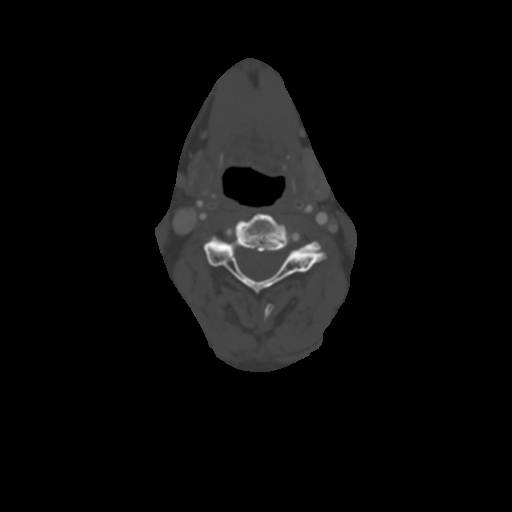
[im 261/313  soft-tissue]
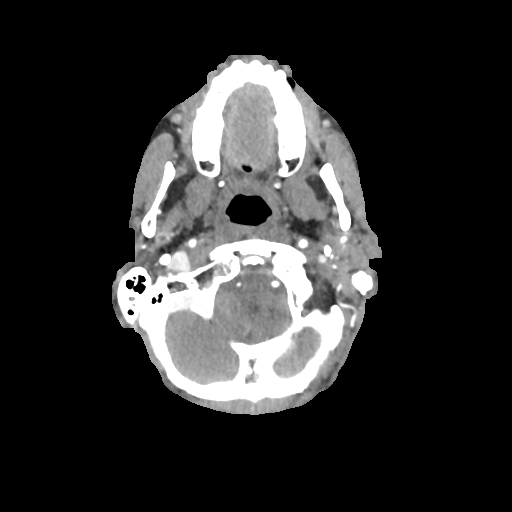

[Series 11: cta neck 1.00 bv48 s3 sag thin mips · sagittal · 0.50mm/px · 3 of 256 slices shown]
[im 6/256  soft-tissue]
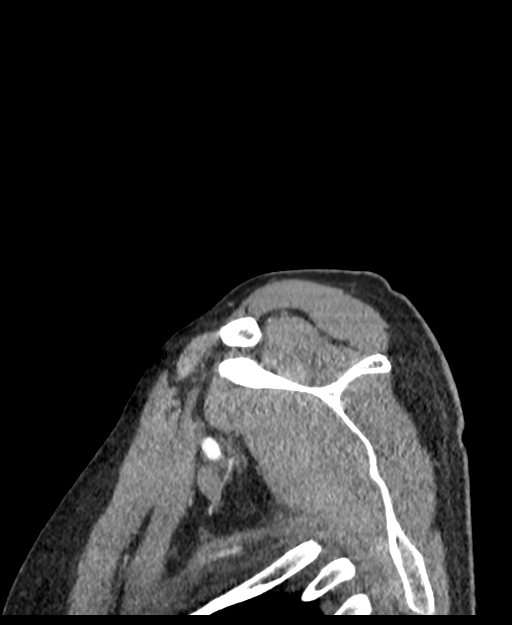
[im 128/256  soft-tissue]
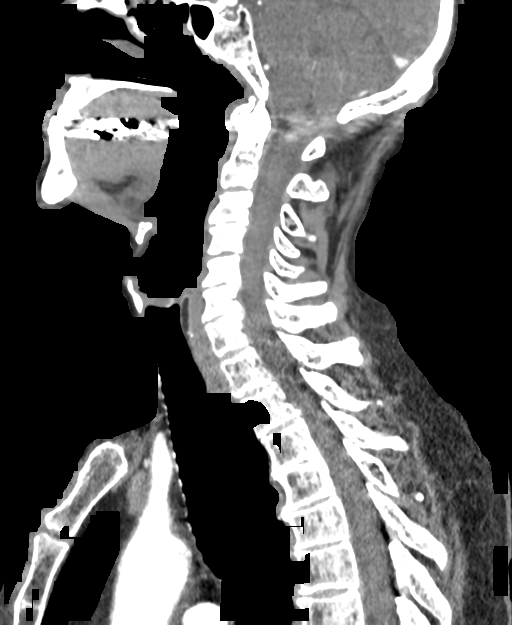
[im 251/256  soft-tissue]
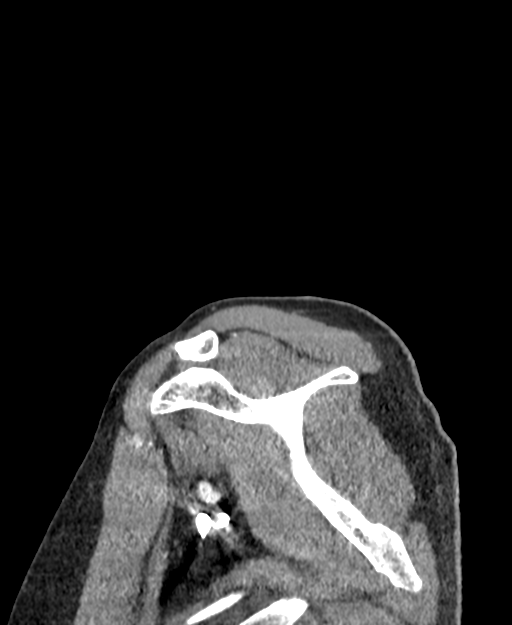

[10 of 35 positions shown; findings below may reference images not displayed]

FINDINGS: Aortic arch: Normal aortic arch. Bovine branching pattern. Proximal
great vessels widely patent.

Right carotid system: Right common carotid artery widely patent.
Extensive calcific plaque at the right carotid bifurcation. The
lumen is significantly narrowed through the calcific plaque. Above
the calcific plaque, there is a soft plaque which is ulcerated
narrowing the lumen to approximately 75% diameter stenosis.
Remainder left internal carotid artery patent.

Left carotid system: Left common carotid artery widely patent. Mild
calcification in the left carotid bulb without significant stenosis.

Vertebral arteries: Both vertebral arteries widely patent to the
basilar without significant stenosis.

Skeleton: Cervical spondylosis.  No acute skeletal abnormality.

Other neck: Mucosal edema paranasal sinuses

Negative for mass or adenopathy in the neck.

Upper chest: Lung apices clear bilaterally. Mild apical scarring
bilaterally.
IMPRESSION: Calcific plaque proximal right internal carotid artery. Just above
the calcific plaque, there is soft plaque which is ulcerated.
Overall approximately 75% diameter stenosis right internal carotid
artery

Calcific plaque left carotid bulb without significant stenosis. Both
vertebral arteries widely patent.

## 2021-05-29 DIAGNOSIS — M79641 Pain in right hand: Secondary | ICD-10-CM | POA: Insufficient documentation

## 2021-05-29 DIAGNOSIS — M65311 Trigger thumb, right thumb: Secondary | ICD-10-CM | POA: Insufficient documentation

## 2021-06-08 ENCOUNTER — Other Ambulatory Visit: Payer: Self-pay

## 2021-06-08 ENCOUNTER — Ambulatory Visit (INDEPENDENT_AMBULATORY_CARE_PROVIDER_SITE_OTHER): Payer: Self-pay | Admitting: Family Medicine

## 2021-06-08 ENCOUNTER — Encounter: Payer: Self-pay | Admitting: Family Medicine

## 2021-06-08 VITALS — BP 128/76 | HR 63 | Temp 97.6°F | Wt 204.4 lb

## 2021-06-08 DIAGNOSIS — R300 Dysuria: Secondary | ICD-10-CM

## 2021-06-08 DIAGNOSIS — N3281 Overactive bladder: Secondary | ICD-10-CM

## 2021-06-08 LAB — POC URINALSYSI DIPSTICK (AUTOMATED)
Bilirubin, UA: NEGATIVE
Blood, UA: NEGATIVE
Glucose, UA: NEGATIVE
Ketones, UA: NEGATIVE
Leukocytes, UA: NEGATIVE
Nitrite, UA: NEGATIVE
Protein, UA: NEGATIVE
Spec Grav, UA: 1.01 (ref 1.010–1.025)
Urobilinogen, UA: 0.2 E.U./dL
pH, UA: 6.5 (ref 5.0–8.0)

## 2021-06-08 MED ORDER — OXYBUTYNIN CHLORIDE ER 5 MG PO TB24: 5.0000 mg | ORAL_TABLET | Freq: Every day | ORAL | 5 refills | Status: DC

## 2021-06-08 NOTE — Progress Notes (Signed)
   Subjective:    Patient ID: Terry Holt, male    DOB: 10-18-1955, 65 y.o.   MRN: [id number]  HPI Here for one year of intermittent urinary symptoms including increased frequency and urgency of urination. There is no burning or discomfort. Sometimes he gets up 3-4 times a night to urinate.    Review of Systems  Constitutional: Negative.   Respiratory: Negative.    Cardiovascular: Negative.   Gastrointestinal: Negative.   Genitourinary:  Positive for frequency and urgency. Negative for difficulty urinating, dysuria, flank pain and hematuria.      Objective:   Physical Exam Constitutional:      Appearance: Normal appearance.  Cardiovascular:     Rate and Rhythm: Normal rate and regular rhythm.     Pulses: Normal pulses.     Heart sounds: Normal heart sounds.  Pulmonary:     Effort: Pulmonary effort is normal.     Breath sounds: Normal breath sounds.  Genitourinary:    Penis: Normal.      Testes: Normal.     Prostate: Normal.     Rectum: Normal.  Neurological:     Mental Status: He is alert.          Assessment & Plan:  Overactive bladder. He will try Oxybutynin XR 5 mg at bedtime. Recheck as needed.  Alysia Penna, MD

## 2021-07-04 ENCOUNTER — Other Ambulatory Visit: Payer: Self-pay

## 2021-07-04 DIAGNOSIS — I6523 Occlusion and stenosis of bilateral carotid arteries: Secondary | ICD-10-CM

## 2021-07-19 ENCOUNTER — Encounter: Payer: Self-pay | Admitting: Family Medicine

## 2021-07-19 ENCOUNTER — Ambulatory Visit: Payer: 59 | Admitting: Family Medicine

## 2021-07-19 VITALS — BP 120/68 | HR 65 | Temp 97.9°F | Wt 204.0 lb

## 2021-07-19 DIAGNOSIS — N3281 Overactive bladder: Secondary | ICD-10-CM

## 2021-07-19 DIAGNOSIS — N401 Enlarged prostate with lower urinary tract symptoms: Secondary | ICD-10-CM

## 2021-07-19 DIAGNOSIS — I10 Essential (primary) hypertension: Secondary | ICD-10-CM

## 2021-07-19 DIAGNOSIS — N138 Other obstructive and reflux uropathy: Secondary | ICD-10-CM

## 2021-07-19 DIAGNOSIS — E039 Hypothyroidism, unspecified: Secondary | ICD-10-CM

## 2021-07-19 LAB — T4, FREE: Free T4: 0.85 ng/dL (ref 0.60–1.60)

## 2021-07-19 LAB — TSH: TSH: 3.52 u[IU]/mL (ref 0.35–5.50)

## 2021-07-19 LAB — T3, FREE: T3, Free: 3.1 pg/mL (ref 2.3–4.2)

## 2021-07-19 MED ORDER — AMLODIPINE BESYLATE 5 MG PO TABS: 5.0000 mg | ORAL_TABLET | Freq: Every day | ORAL | 3 refills | Status: DC

## 2021-07-19 MED ORDER — LISINOPRIL 10 MG PO TABS: 10.0000 mg | ORAL_TABLET | Freq: Every day | ORAL | 3 refills | Status: DC

## 2021-07-19 MED ORDER — LEVOTHYROXINE SODIUM 50 MCG PO TABS: 50.0000 ug | ORAL_TABLET | Freq: Every day | ORAL | 3 refills | Status: DC

## 2021-07-19 NOTE — Progress Notes (Signed)
   Subjective:    Patient ID: Terry Holt, male    DOB: [birth year], 65 y.o.   MRN: [id number]  HPI Here to follow up several issues. His BP has been stable. He had some GERD problems a few weeks ago and he started taking OTC Omeprazole. This worked well and he has stopped the medication. He has been taking Oxybutynin for OAB, and this has been quite successful. He can now sleep through the night without getting up to urinate at all.    Review of Systems  Constitutional: Negative.   Respiratory: Negative.    Cardiovascular: Negative.   Gastrointestinal: Negative.   Genitourinary: Negative.       Objective:   Physical Exam Constitutional:      Appearance: Normal appearance.  Cardiovascular:     Rate and Rhythm: Normal rate and regular rhythm.     Pulses: Normal pulses.     Heart sounds: Normal heart sounds.  Pulmonary:     Effort: Pulmonary effort is normal.     Breath sounds: Normal breath sounds.  Neurological:     Mental Status: He is alert.          Assessment & Plan:  His HTN and GERD and OAB are stable. We will check a thyroid panel today to check the hypothyroidism.  Alysia Penna, MD

## 2021-07-24 ENCOUNTER — Encounter: Payer: Self-pay | Admitting: Student
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
## 2021-07-28 ENCOUNTER — Ambulatory Visit: Payer: 59

## 2021-07-28 ENCOUNTER — Encounter (HOSPITAL_COMMUNITY): Payer: 59
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
## 2021-08-01 ENCOUNTER — Ambulatory Visit (INDEPENDENT_AMBULATORY_CARE_PROVIDER_SITE_OTHER): Payer: 59 | Admitting: Physician Assistant

## 2021-08-01 ENCOUNTER — Encounter: Payer: Self-pay | Admitting: Family Medicine

## 2021-08-01 ENCOUNTER — Other Ambulatory Visit: Payer: Self-pay

## 2021-08-01 ENCOUNTER — Encounter: Payer: Self-pay | Admitting: Student

## 2021-08-01 VITALS — BP 122/72 | HR 70 | Ht 73.0 in | Wt 201.6 lb

## 2021-08-01 DIAGNOSIS — I10 Essential (primary) hypertension: Secondary | ICD-10-CM

## 2021-08-01 DIAGNOSIS — I2510 Atherosclerotic heart disease of native coronary artery without angina pectoris: Secondary | ICD-10-CM

## 2021-08-01 DIAGNOSIS — C01 Malignant neoplasm of base of tongue: Secondary | ICD-10-CM | POA: Diagnosis not present

## 2021-08-01 DIAGNOSIS — I6521 Occlusion and stenosis of right carotid artery: Secondary | ICD-10-CM

## 2021-08-01 DIAGNOSIS — E785 Hyperlipidemia, unspecified: Secondary | ICD-10-CM

## 2021-08-01 MED ORDER — ATORVASTATIN CALCIUM 20 MG PO TABS: 20.0000 mg | ORAL_TABLET | Freq: Every day | ORAL | 3 refills | Status: DC

## 2021-08-01 MED ORDER — LISINOPRIL 10 MG PO TABS: 10.0000 mg | ORAL_TABLET | Freq: Every day | ORAL | 3 refills | Status: DC

## 2021-08-01 MED ORDER — AMLODIPINE BESYLATE 5 MG PO TABS: 5.0000 mg | ORAL_TABLET | Freq: Every day | ORAL | 3 refills | Status: DC

## 2021-08-01 NOTE — Telephone Encounter (Signed)
Call in Omeprazole 40 mg every day, #90 with 3 rf

## 2021-08-01 NOTE — Patient Instructions (Signed)
Medication Instructions:  No Changes *If you need a refill on your cardiac medications before your next appointment, please call your pharmacy*   Lab Work: No Labs If you have labs (blood work) drawn today and your tests are completely normal, you will receive your results only by: MyChart Message (if you have MyChart) OR A paper copy in the mail If you have any lab test that is abnormal or we need to change your treatment, we will call you to review the results.   Testing/Procedures: No Testing   Follow-Up: At [facility], you and your health needs are our priority.  As part of our continuing mission to provide you with exceptional heart care, we have created designated Provider Care Teams.  These Care Teams include your primary Cardiologist (physician) and Advanced Practice Providers (APPs -  Physician Assistants and Nurse Practitioners) who all work together to provide you with the care you need, when you need it.  We recommend signing up for the patient portal called "MyChart".  Sign up information is provided on this After Visit Summary.  MyChart is used to connect with patients for Virtual Visits (Telemedicine).  Patients are able to view lab/test results, encounter notes, upcoming appointments, etc.  Non-urgent messages can be sent to your provider as well.   To learn more about what you can do with MyChart, go to [email or web address].    Your next appointment:   1 year(s)  The format for your next appointment:   In Person  Provider:   Mihai Croitoru, MD       

## 2021-08-03 ENCOUNTER — Other Ambulatory Visit: Payer: Self-pay

## 2021-08-03 MED ORDER — OMEPRAZOLE 40 MG PO CPDR: 40.0000 mg | DELAYED_RELEASE_CAPSULE | Freq: Every day | ORAL | 3 refills | Status: DC

## 2021-08-07 ENCOUNTER — Encounter: Payer: Self-pay | Admitting: Family Medicine

## 2021-08-07 NOTE — Telephone Encounter (Signed)
This may be the beginning of a sinus infection. Call in Augmentin 875 BID for 10 days

## 2021-08-08 ENCOUNTER — Other Ambulatory Visit: Payer: Self-pay

## 2021-08-08 MED ORDER — AMOXICILLIN-POT CLAVULANATE 875-125 MG PO TABS: 1.0000 | ORAL_TABLET | Freq: Two times a day (BID) | ORAL | 0 refills | Status: DC

## 2021-08-11 ENCOUNTER — Encounter (HOSPITAL_COMMUNITY): Payer: 59

## 2021-08-11 ENCOUNTER — Encounter (HOSPITAL_COMMUNITY): Payer: Self-pay

## 2021-08-11 ENCOUNTER — Ambulatory Visit: Payer: 59

## 2021-08-18 ENCOUNTER — Ambulatory Visit (HOSPITAL_COMMUNITY)
Admission: RE | Admit: 2021-08-18 | Discharge: 2021-08-18 | Disposition: A | Discharge status: Home / Self Care | Payer: 59 | Source: Ambulatory Visit | Attending: Vascular Surgery | Admitting: Vascular Surgery

## 2021-08-18 ENCOUNTER — Other Ambulatory Visit: Payer: Self-pay

## 2021-08-18 ENCOUNTER — Ambulatory Visit (INDEPENDENT_AMBULATORY_CARE_PROVIDER_SITE_OTHER): Payer: 59 | Admitting: Physician Assistant

## 2021-08-18 VITALS — BP 124/74 | HR 68 | Temp 98.2°F | Resp 20 | Ht 73.0 in | Wt 205.6 lb

## 2021-08-18 DIAGNOSIS — I6523 Occlusion and stenosis of bilateral carotid arteries: Secondary | ICD-10-CM | POA: Insufficient documentation
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
## 2021-09-06 ENCOUNTER — Other Ambulatory Visit: Payer: Self-pay

## 2021-09-06 MED ORDER — ATORVASTATIN CALCIUM 20 MG PO TABS: 20.0000 mg | ORAL_TABLET | Freq: Every day | ORAL | 3 refills | Status: DC

## 2021-12-01 ENCOUNTER — Other Ambulatory Visit: Payer: Self-pay | Admitting: Family Medicine

## 2022-03-07 ENCOUNTER — Other Ambulatory Visit: Payer: Self-pay | Admitting: Family Medicine

## 2022-06-04 ENCOUNTER — Encounter: Payer: Self-pay | Admitting: Family Medicine

## 2022-06-06 ENCOUNTER — Other Ambulatory Visit: Payer: Self-pay | Admitting: Family Medicine

## 2022-07-09 DIAGNOSIS — M7741 Metatarsalgia, right foot: Secondary | ICD-10-CM | POA: Diagnosis not present

## 2022-07-09 DIAGNOSIS — M79671 Pain in right foot: Secondary | ICD-10-CM | POA: Diagnosis not present

## 2022-07-18 ENCOUNTER — Ambulatory Visit (INDEPENDENT_AMBULATORY_CARE_PROVIDER_SITE_OTHER): Payer: Medicare Other | Admitting: Family Medicine

## 2022-07-18 ENCOUNTER — Encounter: Payer: Self-pay | Admitting: Family Medicine

## 2022-07-18 VITALS — BP 130/78 | HR 74 | Temp 98.3°F | Ht 73.5 in | Wt 204.0 lb

## 2022-07-18 DIAGNOSIS — M1712 Unilateral primary osteoarthritis, left knee: Secondary | ICD-10-CM

## 2022-07-18 DIAGNOSIS — N3281 Overactive bladder: Secondary | ICD-10-CM

## 2022-07-18 DIAGNOSIS — R739 Hyperglycemia, unspecified: Secondary | ICD-10-CM | POA: Diagnosis not present

## 2022-07-18 DIAGNOSIS — N138 Other obstructive and reflux uropathy: Secondary | ICD-10-CM | POA: Diagnosis not present

## 2022-07-18 DIAGNOSIS — E039 Hypothyroidism, unspecified: Secondary | ICD-10-CM | POA: Diagnosis not present

## 2022-07-18 DIAGNOSIS — E291 Testicular hypofunction: Secondary | ICD-10-CM

## 2022-07-18 DIAGNOSIS — E785 Hyperlipidemia, unspecified: Secondary | ICD-10-CM | POA: Diagnosis not present

## 2022-07-18 DIAGNOSIS — Z23 Encounter for immunization: Secondary | ICD-10-CM

## 2022-07-18 DIAGNOSIS — I10 Essential (primary) hypertension: Secondary | ICD-10-CM

## 2022-07-18 DIAGNOSIS — C01 Malignant neoplasm of base of tongue: Secondary | ICD-10-CM

## 2022-07-18 DIAGNOSIS — I6521 Occlusion and stenosis of right carotid artery: Secondary | ICD-10-CM

## 2022-07-18 DIAGNOSIS — N401 Enlarged prostate with lower urinary tract symptoms: Secondary | ICD-10-CM

## 2022-07-18 DIAGNOSIS — M1711 Unilateral primary osteoarthritis, right knee: Secondary | ICD-10-CM

## 2022-07-18 LAB — BASIC METABOLIC PANEL
BUN: 15 mg/dL (ref 6–23)
CO2: 30 mEq/L (ref 19–32)
Calcium: 9.3 mg/dL (ref 8.4–10.5)
Chloride: 97 mEq/L (ref 96–112)
Creatinine, Ser: 0.84 mg/dL (ref 0.40–1.50)
GFR: 91.82 mL/min (ref >60.00)
Glucose, Bld: 94 mg/dL (ref 70–99)
Potassium: 4.6 mEq/L (ref 3.5–5.1)
Sodium: 134 mEq/L — ABNORMAL LOW (ref 135–145)

## 2022-07-18 LAB — LIPID PANEL
Cholesterol: 135 mg/dL (ref 0–200)
HDL: 69.6 mg/dL (ref >39.00)
LDL Cholesterol: 58 mg/dL (ref 0–99)
NonHDL: 65.89
Total CHOL/HDL Ratio: 2
Triglycerides: 41 mg/dL (ref 0.0–149.0)
VLDL: 8.2 mg/dL (ref 0.0–40.0)

## 2022-07-18 LAB — CBC WITH DIFFERENTIAL/PLATELET
Basophils Absolute: 0.1 10*3/uL (ref 0.0–0.1)
Basophils Relative: 0.5 % (ref 0.0–3.0)
Eosinophils Absolute: 0.2 10*3/uL (ref 0.0–0.7)
Eosinophils Relative: 2.3 % (ref 0.0–5.0)
HCT: 42.2 % (ref 39.0–52.0)
Hemoglobin: 14.9 g/dL (ref 13.0–17.0)
Lymphocytes Relative: 9.8 % — ABNORMAL LOW (ref 12.0–46.0)
Lymphs Abs: 1 10*3/uL (ref 0.7–4.0)
MCHC: 35.3 g/dL (ref 30.0–36.0)
MCV: 95 fl (ref 78.0–100.0)
Monocytes Absolute: 1.3 10*3/uL — ABNORMAL HIGH (ref 0.1–1.0)
Monocytes Relative: 12.8 % — ABNORMAL HIGH (ref 3.0–12.0)
Neutro Abs: 7.5 10*3/uL (ref 1.4–7.7)
Neutrophils Relative %: 74.6 % (ref 43.0–77.0)
Platelets: 203 10*3/uL (ref 150.0–400.0)
RBC: 4.44 Mil/uL (ref 4.22–5.81)
RDW: 13.6 % (ref 11.5–15.5)
WBC: 10 10*3/uL (ref 4.0–10.5)

## 2022-07-18 LAB — HEPATIC FUNCTION PANEL
ALT: 24 U/L (ref 0–53)
AST: 38 U/L — ABNORMAL HIGH (ref 0–37)
Albumin: 4.6 g/dL (ref 3.5–5.2)
Alkaline Phosphatase: 58 U/L (ref 39–117)
Bilirubin, Direct: 0.3 mg/dL (ref 0.0–0.3)
Total Bilirubin: 1 mg/dL (ref 0.2–1.2)
Total Protein: 6.9 g/dL (ref 6.0–8.3)

## 2022-07-18 LAB — PSA: PSA: 1.42 ng/mL (ref 0.10–4.00)

## 2022-07-18 LAB — TSH: TSH: 3.28 u[IU]/mL (ref 0.35–5.50)

## 2022-07-18 LAB — HEMOGLOBIN A1C: Hgb A1c MFr Bld: 5 % (ref 4.6–6.5)

## 2022-07-18 LAB — T3, FREE: T3, Free: 3.8 pg/mL (ref 2.3–4.2)

## 2022-07-18 LAB — T4, FREE: Free T4: 1.04 ng/dL (ref 0.60–1.60)

## 2022-07-18 MED ORDER — LEVOTHYROXINE SODIUM 50 MCG PO TABS: 50.0000 ug | ORAL_TABLET | Freq: Every day | ORAL | 3 refills | Status: DC

## 2022-07-18 MED ORDER — OXYBUTYNIN CHLORIDE ER 5 MG PO TB24: 5.0000 mg | ORAL_TABLET | Freq: Every day | ORAL | 3 refills | Status: DC

## 2022-07-18 MED ORDER — LISINOPRIL 10 MG PO TABS: 10.0000 mg | ORAL_TABLET | Freq: Every day | ORAL | 3 refills | Status: DC

## 2022-07-18 MED ORDER — AMLODIPINE BESYLATE 5 MG PO TABS: 5.0000 mg | ORAL_TABLET | Freq: Every day | ORAL | 3 refills | Status: DC

## 2022-07-18 MED ORDER — OMEPRAZOLE 40 MG PO CPDR: 40.0000 mg | DELAYED_RELEASE_CAPSULE | Freq: Every day | ORAL | 3 refills | Status: DC

## 2022-07-18 NOTE — Addendum Note (Signed)
Addended by: Wyvonne Lenz on: 07/18/2022 08:58 AM   Modules accepted: Orders

## 2022-07-18 NOTE — Progress Notes (Signed)
Subjective:    Patient ID: Terry Holt, male    DOB: 10-Feb-1956, 66 y.o.   MRN: [id number]  HPI Here to follow up on issues. He is doing well. He sleeps through the night without having to urinate. He is working out at [employer]. 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 His BP is stable. He is 7 years out from his oral cancer surgery. His GERD is stable. He has been getting yearly carotid dopplers to follow the stenosis.    Review of Systems  Constitutional: Negative.   HENT: Negative.    Eyes: Negative.   Respiratory: Negative.    Cardiovascular: Negative.   Gastrointestinal: Negative.   Genitourinary: Negative.   Musculoskeletal:  Positive for arthralgias.  Skin: Negative.   Neurological: Negative.   Psychiatric/Behavioral: Negative.         Objective:   Physical Exam Constitutional:      General: He is not in acute distress.    Appearance: Normal appearance. He is well-developed. He is not diaphoretic.  HENT:     Head: Normocephalic and atraumatic.     Right Ear: External ear normal.     Left Ear: External ear normal.     Nose: Nose normal.     Mouth/Throat:     Pharynx: No oropharyngeal exudate.  Eyes:     General: No scleral icterus.       Right eye: No discharge.        Left eye: No discharge.     Conjunctiva/sclera: Conjunctivae normal.     Pupils: Pupils are equal, round, and reactive to light.  Neck:     Thyroid: No thyromegaly.     Vascular: No JVD.     Trachea: No tracheal deviation.  Cardiovascular:     Rate and Rhythm: Normal rate and regular rhythm.     Heart sounds: Normal heart sounds. No murmur heard.    No friction rub. No gallop.  Pulmonary:     Effort: Pulmonary effort is normal. No respiratory distress.     Breath sounds: Normal breath sounds. No wheezing or rales.  Chest:     Chest wall: No tenderness.  Abdominal:     General: Bowel sounds are normal. There is no distension.      Palpations: Abdomen is soft. There is no mass.     Tenderness: There is no abdominal tenderness. There is no guarding or rebound.  Genitourinary:    Penis: Normal. No tenderness.      Testes: Normal.     Prostate: Normal.     Rectum: Normal. Guaiac result negative.  Musculoskeletal:        General: No tenderness. Normal range of motion.     Cervical back: Neck supple.  Lymphadenopathy:     Cervical: No cervical adenopathy.  Skin:    General: Skin is warm and dry.     Coloration: Skin is not pale.     Findings: No erythema or rash.  Neurological:     Mental Status: He is alert and oriented to person, place, and time.     Cranial Nerves: No cranial nerve deficit.     Motor: No abnormal muscle tone.     Coordination: Coordination normal.     Deep Tendon Reflexes: Reflexes are normal and symmetric. Reflexes normal.  Psychiatric:        Behavior: Behavior normal.        Thought Content: Thought content  normal.        Judgment: Judgment normal.           Assessment & Plan:  His HTN and GERD and OAB are stable. He will continue to see Orthopedics for the knees. We will get fasting labs to check lipids, thyroid levels, etc. Set up another carotid doppler. We spent a total of ( 34  ) minutes reviewing records and discussing these issues.  Alysia Penna, MD  .

## 2022-07-19 ENCOUNTER — Encounter: Payer: Self-pay | Admitting: Family Medicine

## 2022-07-20 ENCOUNTER — Other Ambulatory Visit: Payer: Self-pay

## 2022-07-20 MED ORDER — AZITHROMYCIN 250 MG PO TABS: ORAL_TABLET | ORAL | 0 refills | Status: AC

## 2022-07-20 NOTE — Telephone Encounter (Signed)
Call in a Zpack  ?
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
## 2022-08-21 ENCOUNTER — Encounter: Payer: Self-pay | Admitting: Physician Assistant

## 2022-08-21 ENCOUNTER — Ambulatory Visit (HOSPITAL_COMMUNITY)
Admission: RE | Admit: 2022-08-21 | Discharge: 2022-08-21 | Disposition: A | Discharge status: Home / Self Care | Payer: Medicare Other | Source: Ambulatory Visit | Attending: Vascular Surgery | Admitting: Vascular Surgery

## 2022-08-21 ENCOUNTER — Ambulatory Visit: Payer: Medicare Other | Admitting: Physician Assistant

## 2022-08-21 VITALS — BP 160/89 | HR 74 | Temp 98.1°F | Ht 73.5 in | Wt 201.6 lb

## 2022-08-21 DIAGNOSIS — I6521 Occlusion and stenosis of right carotid artery: Secondary | ICD-10-CM | POA: Diagnosis not present

## 2022-08-21 DIAGNOSIS — I6523 Occlusion and stenosis of bilateral carotid arteries: Secondary | ICD-10-CM | POA: Diagnosis not present

## 2022-09-03 ENCOUNTER — Other Ambulatory Visit: Payer: Self-pay | Admitting: Physician Assistant

## 2022-10-29 DIAGNOSIS — M25462 Effusion, left knee: Secondary | ICD-10-CM | POA: Diagnosis not present

## 2022-10-29 DIAGNOSIS — M25562 Pain in left knee: Secondary | ICD-10-CM | POA: Diagnosis not present

## 2022-10-29 DIAGNOSIS — G5761 Lesion of plantar nerve, right lower limb: Secondary | ICD-10-CM | POA: Diagnosis not present

## 2022-10-29 DIAGNOSIS — Z96652 Presence of left artificial knee joint: Secondary | ICD-10-CM | POA: Diagnosis not present

## 2022-10-30 ENCOUNTER — Telehealth: Payer: Self-pay | Admitting: Family Medicine

## 2022-10-30 NOTE — Telephone Encounter (Signed)
Called to schedule welcome to medicare visit, patient states he just had a cpe and declined.

## 2022-11-05 DIAGNOSIS — K080 Exfoliation of teeth due to systemic causes: Secondary | ICD-10-CM | POA: Diagnosis not present

## 2022-11-12 DIAGNOSIS — M25562 Pain in left knee: Secondary | ICD-10-CM | POA: Diagnosis not present

## 2022-11-12 DIAGNOSIS — M25462 Effusion, left knee: Secondary | ICD-10-CM | POA: Diagnosis not present

## 2022-11-12 DIAGNOSIS — M7122 Synovial cyst of popliteal space [Baker], left knee: Secondary | ICD-10-CM | POA: Diagnosis not present

## 2022-11-20 DIAGNOSIS — Z96652 Presence of left artificial knee joint: Secondary | ICD-10-CM | POA: Diagnosis not present

## 2022-11-20 DIAGNOSIS — M7122 Synovial cyst of popliteal space [Baker], left knee: Secondary | ICD-10-CM | POA: Diagnosis not present

## 2022-11-30 ENCOUNTER — Encounter: Payer: Self-pay | Admitting: Family Medicine

## 2022-11-30 ENCOUNTER — Other Ambulatory Visit: Payer: Self-pay

## 2022-11-30 MED ORDER — ATORVASTATIN CALCIUM 20 MG PO TABS: 20.0000 mg | ORAL_TABLET | Freq: Every day | ORAL | 3 refills | Status: DC

## 2023-01-18 DIAGNOSIS — Z96651 Presence of right artificial knee joint: Secondary | ICD-10-CM | POA: Diagnosis not present

## 2023-01-18 DIAGNOSIS — M25561 Pain in right knee: Secondary | ICD-10-CM | POA: Diagnosis not present

## 2023-01-18 DIAGNOSIS — M7121 Synovial cyst of popliteal space [Baker], right knee: Secondary | ICD-10-CM | POA: Diagnosis not present

## 2023-01-18 DIAGNOSIS — M7122 Synovial cyst of popliteal space [Baker], left knee: Secondary | ICD-10-CM | POA: Diagnosis not present

## 2023-02-05 DIAGNOSIS — Z4789 Encounter for other orthopedic aftercare: Secondary | ICD-10-CM | POA: Diagnosis not present

## 2023-02-20 DIAGNOSIS — M9901 Segmental and somatic dysfunction of cervical region: Secondary | ICD-10-CM | POA: Diagnosis not present

## 2023-02-20 DIAGNOSIS — M9905 Segmental and somatic dysfunction of pelvic region: Secondary | ICD-10-CM | POA: Diagnosis not present

## 2023-02-20 DIAGNOSIS — M9902 Segmental and somatic dysfunction of thoracic region: Secondary | ICD-10-CM | POA: Diagnosis not present

## 2023-02-20 DIAGNOSIS — F0781 Postconcussional syndrome: Secondary | ICD-10-CM | POA: Diagnosis not present

## 2023-02-20 DIAGNOSIS — H814 Vertigo of central origin: Secondary | ICD-10-CM | POA: Diagnosis not present

## 2023-02-20 DIAGNOSIS — M9903 Segmental and somatic dysfunction of lumbar region: Secondary | ICD-10-CM | POA: Diagnosis not present

## 2023-02-20 DIAGNOSIS — H8111 Benign paroxysmal vertigo, right ear: Secondary | ICD-10-CM | POA: Diagnosis not present

## 2023-05-13 DIAGNOSIS — K080 Exfoliation of teeth due to systemic causes: Secondary | ICD-10-CM | POA: Diagnosis not present

## 2023-06-17 ENCOUNTER — Encounter: Payer: Self-pay | Admitting: Family Medicine

## 2023-06-17 DIAGNOSIS — H9190 Unspecified hearing loss, unspecified ear: Secondary | ICD-10-CM

## 2023-06-17 NOTE — Telephone Encounter (Signed)
I did the referral 

## 2023-06-19 DIAGNOSIS — D692 Other nonthrombocytopenic purpura: Secondary | ICD-10-CM | POA: Diagnosis not present

## 2023-06-19 DIAGNOSIS — D225 Melanocytic nevi of trunk: Secondary | ICD-10-CM | POA: Diagnosis not present

## 2023-06-19 DIAGNOSIS — L308 Other specified dermatitis: Secondary | ICD-10-CM | POA: Diagnosis not present

## 2023-06-19 DIAGNOSIS — D485 Neoplasm of uncertain behavior of skin: Secondary | ICD-10-CM | POA: Diagnosis not present

## 2023-06-19 DIAGNOSIS — Z1283 Encounter for screening for malignant neoplasm of skin: Secondary | ICD-10-CM | POA: Diagnosis not present

## 2023-07-09 ENCOUNTER — Other Ambulatory Visit: Payer: Self-pay | Admitting: Family Medicine

## 2023-07-20 ENCOUNTER — Encounter: Payer: Self-pay | Admitting: Family Medicine

## 2023-07-22 MED ORDER — OMEPRAZOLE 40 MG PO CPDR: 40.0000 mg | DELAYED_RELEASE_CAPSULE | Freq: Every day | ORAL | 0 refills | Status: DC

## 2023-07-29 ENCOUNTER — Ambulatory Visit: Payer: Medicare Other | Attending: Family Medicine | Admitting: Audiologist

## 2023-07-29 DIAGNOSIS — H903 Sensorineural hearing loss, bilateral: Secondary | ICD-10-CM | POA: Diagnosis not present
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
## 2023-08-12 DIAGNOSIS — M9902 Segmental and somatic dysfunction of thoracic region: Secondary | ICD-10-CM | POA: Diagnosis not present

## 2023-08-12 DIAGNOSIS — M9901 Segmental and somatic dysfunction of cervical region: Secondary | ICD-10-CM | POA: Diagnosis not present

## 2023-08-12 DIAGNOSIS — M9903 Segmental and somatic dysfunction of lumbar region: Secondary | ICD-10-CM | POA: Diagnosis not present

## 2023-08-12 DIAGNOSIS — M9905 Segmental and somatic dysfunction of pelvic region: Secondary | ICD-10-CM | POA: Diagnosis not present

## 2023-08-19 ENCOUNTER — Encounter: Payer: Self-pay | Admitting: Family Medicine

## 2023-08-19 ENCOUNTER — Ambulatory Visit (INDEPENDENT_AMBULATORY_CARE_PROVIDER_SITE_OTHER): Payer: Medicare Other | Admitting: Family Medicine

## 2023-08-19 VITALS — BP 130/76 | HR 72 | Temp 97.7°F | Wt 208.0 lb

## 2023-08-19 DIAGNOSIS — L309 Dermatitis, unspecified: Secondary | ICD-10-CM | POA: Diagnosis not present

## 2023-08-19 DIAGNOSIS — N401 Enlarged prostate with lower urinary tract symptoms: Secondary | ICD-10-CM | POA: Diagnosis not present

## 2023-08-19 DIAGNOSIS — R062 Wheezing: Secondary | ICD-10-CM

## 2023-08-19 DIAGNOSIS — N138 Other obstructive and reflux uropathy: Secondary | ICD-10-CM | POA: Diagnosis not present

## 2023-08-19 MED ORDER — BETAMETHASONE DIPROPIONATE 0.05 % EX CREA: TOPICAL_CREAM | Freq: Two times a day (BID) | CUTANEOUS | 5 refills | Status: DC

## 2023-08-19 MED ORDER — PREDNISONE 10 MG PO TABS: ORAL_TABLET | ORAL | 0 refills | Status: DC

## 2023-08-19 MED ORDER — TAMSULOSIN HCL 0.4 MG PO CAPS: 0.4000 mg | ORAL_CAPSULE | Freq: Every day | ORAL | 3 refills | Status: DC

## 2023-08-19 NOTE — Progress Notes (Signed)
   Subjective:    Patient ID: Terry Holt, male    DOB: February 19, 1956, 67 y.o.   MRN: [id number]  HPI Here for 3 issues. First about 3 months ago he developed some mild wheezing that comes and goes. No cough or SOB. Also 3 months ago he began to have trouble passing his urine. The stream is slower than normal and it takes awhile to get started. No burning or pain. He has been taking Oxybutynin for several years. Also about 3 weeks ago he developed an intensely itching rash on his trunk, arms, and legs. He saw his dermatologist, Dr. Nita Sells, who prescribed Betamethasone dipropionate cream which he applies BID. He also takes Benadryl several times a day. This has not helped much. His wife notes that his 45 year old brother has recently been diagnosed with a cutaneous lymphoproliferative disorder. She knows this is not typically heritable, but she is concerned. No recent changes in medications, soaps, detergents, etc.    Review of Systems  Constitutional: Negative.   Respiratory:  Positive for wheezing. Negative for cough and shortness of breath.   Cardiovascular: Negative.   Gastrointestinal: Negative.   Genitourinary:  Positive for difficulty urinating and frequency. Negative for flank pain, hematuria and urgency.  Skin:  Positive for rash.       Objective:   Physical Exam Constitutional:      Appearance: Normal appearance. He is not ill-appearing.  Cardiovascular:     Rate and Rhythm: Normal rate and regular rhythm.     Pulses: Normal pulses.     Heart sounds: Normal heart sounds.  Pulmonary:     Effort: Pulmonary effort is normal.     Breath sounds: Normal breath sounds.  Abdominal:     Tenderness: There is no right CVA tenderness or left CVA tenderness.  Skin:    Comments: Widespread rough or scaly macular erythematous areas on trunk and dorsal arms and legs   Neurological:     Mental Status: He is alert.           Assessment & Plan:  First, he seems to have eczema  rather than true hives. We will give him a 16 day taper of Prednisone, starting with 40 mg a day. He will take Zyrtec 10 mg BID and Pepcid 20 mg BID. He will continue to apply Betamethasone cream BID. Second, the wheezing may be from reactive airways, and the Prednisone should help this as well. Third, his urinary symptoms are more suggestive of BPH than OAB. He wil stop the Oxybutynin and start Tamsulosin 0.4 mg at bedtime. Recheck in 3 weeks. Gershon Crane, MD

## 2023-08-20 ENCOUNTER — Encounter: Payer: Self-pay | Admitting: Family Medicine

## 2023-08-26 ENCOUNTER — Other Ambulatory Visit: Payer: Self-pay

## 2023-08-26 DIAGNOSIS — I6523 Occlusion and stenosis of bilateral carotid arteries: Secondary | ICD-10-CM

## 2023-08-28 NOTE — Telephone Encounter (Signed)
 Have him keep taking the Prednisone, but we will lower the dose. Tell him to take 2 tablets a day for 7 days, then one tablet a day for 7 days, then stop

## 2023-08-30 NOTE — Telephone Encounter (Signed)
 Spoke to pt's wife.pt was with wife.  And inform her of provider's advise.   She reports that since they have not got any reply from us , pt has been taking Rx as it prescribes. She states pt's side effect has gone away but still having bad itching. She continues that pt notice sx started back when decreasing the prednisone . Sx worsen at night. Along with prednisone , pt is taking benadryl  every 4 hr, anti-cream multiple times a day. Wife states pt will start his 1 tab a day for 4 days tomorrow. And pt has f/u appt on 09/03/2023 with Dr. Johnny.

## 2023-08-30 NOTE — Telephone Encounter (Signed)
 Noted.

## 2023-09-03 ENCOUNTER — Ambulatory Visit (INDEPENDENT_AMBULATORY_CARE_PROVIDER_SITE_OTHER): Payer: Medicare Other | Admitting: Family Medicine

## 2023-09-03 ENCOUNTER — Encounter: Payer: Self-pay | Admitting: Family Medicine

## 2023-09-03 VITALS — BP 124/62 | HR 78 | Temp 98.1°F | Wt 210.0 lb

## 2023-09-03 DIAGNOSIS — R739 Hyperglycemia, unspecified: Secondary | ICD-10-CM

## 2023-09-03 DIAGNOSIS — L509 Urticaria, unspecified: Secondary | ICD-10-CM | POA: Diagnosis not present

## 2023-09-03 DIAGNOSIS — E538 Deficiency of other specified B group vitamins: Secondary | ICD-10-CM | POA: Diagnosis not present

## 2023-09-03 DIAGNOSIS — N138 Other obstructive and reflux uropathy: Secondary | ICD-10-CM

## 2023-09-03 DIAGNOSIS — N401 Enlarged prostate with lower urinary tract symptoms: Secondary | ICD-10-CM | POA: Diagnosis not present

## 2023-09-03 LAB — CBC WITH DIFFERENTIAL/PLATELET
Basophils Absolute: 0 10*3/uL (ref 0.0–0.1)
Basophils Relative: 0.5 % (ref 0.0–3.0)
Eosinophils Absolute: 0.1 10*3/uL (ref 0.0–0.7)
Eosinophils Relative: 1.1 % (ref 0.0–5.0)
HCT: 39.4 % (ref 39.0–52.0)
Hemoglobin: 12.9 g/dL — ABNORMAL LOW (ref 13.0–17.0)
Lymphocytes Relative: 10.8 % — ABNORMAL LOW (ref 12.0–46.0)
Lymphs Abs: 0.9 10*3/uL (ref 0.7–4.0)
MCHC: 32.7 g/dL (ref 30.0–36.0)
MCV: 90.2 fL (ref 78.0–100.0)
Monocytes Absolute: 0.9 10*3/uL (ref 0.1–1.0)
Monocytes Relative: 10.5 % (ref 3.0–12.0)
Neutro Abs: 6.3 10*3/uL (ref 1.4–7.7)
Neutrophils Relative %: 77.1 % — ABNORMAL HIGH (ref 43.0–77.0)
Platelets: 264 10*3/uL (ref 150.0–400.0)
RBC: 4.36 Mil/uL (ref 4.22–5.81)
RDW: 13.4 % (ref 11.5–15.5)
WBC: 8.2 10*3/uL (ref 4.0–10.5)

## 2023-09-03 LAB — HEPATIC FUNCTION PANEL
ALT: 13 U/L (ref 0–53)
AST: 18 U/L (ref 0–37)
Albumin: 4.6 g/dL (ref 3.5–5.2)
Alkaline Phosphatase: 58 U/L (ref 39–117)
Bilirubin, Direct: 0.1 mg/dL (ref 0.0–0.3)
Total Bilirubin: 0.5 mg/dL (ref 0.2–1.2)
Total Protein: 6.7 g/dL (ref 6.0–8.3)

## 2023-09-03 LAB — BASIC METABOLIC PANEL
BUN: 19 mg/dL (ref 6–23)
CO2: 30 meq/L (ref 19–32)
Calcium: 9.3 mg/dL (ref 8.4–10.5)
Chloride: 94 meq/L — ABNORMAL LOW (ref 96–112)
Creatinine, Ser: 1 mg/dL (ref 0.40–1.50)
GFR: 78.63 mL/min (ref >60.00)
Glucose, Bld: 59 mg/dL — ABNORMAL LOW (ref 70–99)
Potassium: 4.1 meq/L (ref 3.5–5.1)
Sodium: 134 meq/L — ABNORMAL LOW (ref 135–145)

## 2023-09-03 LAB — TSH: TSH: 4.85 u[IU]/mL (ref 0.35–5.50)

## 2023-09-03 LAB — VITAMIN B12: Vitamin B-12: 1137 pg/mL — ABNORMAL HIGH (ref 211–911)

## 2023-09-03 LAB — HEMOGLOBIN A1C: Hgb A1c MFr Bld: 5.4 % (ref 4.6–6.5)

## 2023-09-03 MED ORDER — GABAPENTIN 100 MG PO CAPS: 100.0000 mg | ORAL_CAPSULE | Freq: Three times a day (TID) | ORAL | 3 refills | Status: DC

## 2023-09-03 NOTE — Progress Notes (Signed)
   Subjective:    Patient ID: Terry Holt, male    DOB: [birth year], 68 y.o.   MRN: [id number]  HPI Here to follow up on hives. He has completed a 16 day taper of Prednisone , and he is still taking Zyrtec BID and Pepcid BID. The rash itself went away, and the itching was well controlled on the higher Prednisone  doses. However the itching returned after he got down to the lower doses. He asks if this could be a form of neuropathy rather than hives. Also he had started taking Tamsulosin  for BPH, and he is pleased with how he can pass urine easily now with a stronger stream. However he says this has given him negative sexual effects and it makes him lightheaded.    Review of Systems  Constitutional: Negative.   Respiratory: Negative.    Cardiovascular: Negative.   Genitourinary: Negative.   Neurological:  Positive for light-headedness.       Objective:   Physical Exam Constitutional:      Appearance: Normal appearance.  Cardiovascular:     Rate and Rhythm: Normal rate and regular rhythm.     Pulses: Normal pulses.     Heart sounds: Normal heart sounds.  Pulmonary:     Effort: Pulmonary effort is normal.     Breath sounds: Normal breath sounds.  Skin:    Findings: No erythema or rash.  Neurological:     Mental Status: He is alert.           Assessment & Plan:  For the hives (or neuropathy) we will get labs to check liver enzymes, etc. He will stay on Zyrtec and Pepcid. He will try Gabapentin  100 mg TID. For the BPH, we will decrease the Tamsulosin  to taking it every 2 or 3 days.  Garnette Olmsted, MD

## 2023-09-05 ENCOUNTER — Ambulatory Visit: Payer: Medicare Other | Admitting: Physician Assistant

## 2023-09-05 ENCOUNTER — Ambulatory Visit (HOSPITAL_COMMUNITY)
Admission: RE | Admit: 2023-09-05 | Discharge: 2023-09-05 | Disposition: A | Discharge status: Home / Self Care | Payer: Medicare Other | Source: Ambulatory Visit | Attending: Vascular Surgery | Admitting: Vascular Surgery

## 2023-09-05 VITALS — BP 148/83 | HR 74 | Temp 97.9°F | Resp 18 | Ht 73.5 in | Wt 208.6 lb

## 2023-09-05 DIAGNOSIS — Z95828 Presence of other vascular implants and grafts: Secondary | ICD-10-CM

## 2023-09-05 DIAGNOSIS — Z9889 Other specified postprocedural states: Secondary | ICD-10-CM | POA: Diagnosis not present

## 2023-09-05 DIAGNOSIS — I6523 Occlusion and stenosis of bilateral carotid arteries: Secondary | ICD-10-CM | POA: Insufficient documentation

## 2023-09-05 DIAGNOSIS — I6521 Occlusion and stenosis of right carotid artery: Secondary | ICD-10-CM | POA: Diagnosis not present
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
## 2023-09-19 ENCOUNTER — Ambulatory Visit: Payer: Medicare Other | Admitting: Family Medicine

## 2023-09-19 ENCOUNTER — Encounter: Payer: Self-pay | Admitting: Family Medicine

## 2023-09-19 VITALS — BP 110/70 | HR 74 | Temp 98.4°F | Wt 207.0 lb

## 2023-09-19 DIAGNOSIS — L509 Urticaria, unspecified: Secondary | ICD-10-CM | POA: Diagnosis not present

## 2023-09-19 DIAGNOSIS — L03011 Cellulitis of right finger: Secondary | ICD-10-CM | POA: Diagnosis not present

## 2023-09-19 DIAGNOSIS — T7840XA Allergy, unspecified, initial encounter: Secondary | ICD-10-CM | POA: Diagnosis not present

## 2023-09-19 MED ORDER — CEPHALEXIN 500 MG PO CAPS: 500.0000 mg | ORAL_CAPSULE | Freq: Four times a day (QID) | ORAL | 0 refills | Status: DC

## 2023-09-19 MED ORDER — AMLODIPINE BESYLATE 10 MG PO TABS: 10.0000 mg | ORAL_TABLET | Freq: Every day | ORAL | 2 refills | Status: DC

## 2023-09-19 NOTE — Progress Notes (Signed)
   Subjective:    Patient ID: Terry Holt, male    DOB: 1956/04/24, 68 y.o.   MRN: [id number]  HPI Here for several issues. First he continues to have an itchy rash that comes and goes on his trunk and arms. This started about 4 weeks ago. We felt it was hives so he took a 16 day taper of Prednisone (this ended 2 weeks ago). He he has also been taking Zyrtec and Pepcid. The hives responded to the higher doses of Prednisone but they came back as the taper progressed. Now for the past 2 days he has also had swelling and pain in the right thumb. He is not aware of any trauma, but he has been cutting up wood this week. He had extensive labs drawn on 09-03-23 and these were normal.    Review of Systems  Constitutional: Negative.   Respiratory: Negative.    Cardiovascular: Negative.   Gastrointestinal: Negative.   Genitourinary: Negative.   Musculoskeletal:  Positive for arthralgias.  Skin:  Positive for color change and rash.       Objective:   Physical Exam Constitutional:      Appearance: Normal appearance. He is not ill-appearing.  Cardiovascular:     Rate and Rhythm: Normal rate and regular rhythm.     Pulses: Normal pulses.     Heart sounds: Normal heart sounds.  Pulmonary:     Effort: Pulmonary effort is normal.     Breath sounds: Normal breath sounds.  Skin:    Comments: Right thumb has a small puncture wound on the distal pad. No foreign objects are felt. The entire thumb is swollen, red, warm, and tender. The erythema extends up just proximal to the wrist   Neurological:     Mental Status: He is alert.           Assessment & Plan:  He has a cellulitis from a recent puncture wound to the right thumb. This may have been accelerated by his recent use of Prednisone. We will treat with 10 days of Keflex 500 mg QID. We also noted that he had a rash reaction in 2009 to Valsartan. I believe his rash may be an allergic reaction to the Lisinopril he is taking (even though he  has been of this for years). We agreed to stop this and to increase the Amlodipine to 10 mg daily. He will follow up with [place] in a few weeks. He will also see his dermatologist, Dr. Margo Aye, tomorrow.  Gershon Crane, MD

## 2023-09-20 ENCOUNTER — Encounter: Payer: Self-pay | Admitting: Family Medicine

## 2023-09-20 DIAGNOSIS — L308 Other specified dermatitis: Secondary | ICD-10-CM | POA: Diagnosis not present

## 2023-09-25 NOTE — Telephone Encounter (Signed)
 Yes he can continue to take the Lisinopril  and treat the eczema

## 2023-09-30 ENCOUNTER — Encounter: Payer: Self-pay | Admitting: Family Medicine

## 2023-09-30 NOTE — Telephone Encounter (Signed)
 Stop the Amlodipine  10 mg and call in Amlodipine  5 mg daily, #90 with 3 rf. Also call in Lisinopril  10 mg daily, #90 with 3 rf. As for the Gabapentin , decrease this to taking only one pill at bedtime for a few weeks. After that maybe we can stop it.

## 2023-10-01 ENCOUNTER — Other Ambulatory Visit: Payer: Self-pay

## 2023-10-01 DIAGNOSIS — I6523 Occlusion and stenosis of bilateral carotid arteries: Secondary | ICD-10-CM

## 2023-10-01 MED ORDER — AMLODIPINE BESYLATE 5 MG PO TABS: 5.0000 mg | ORAL_TABLET | Freq: Every day | ORAL | 3 refills | Status: DC

## 2023-10-01 MED ORDER — LISINOPRIL 10 MG PO TABS: 10.0000 mg | ORAL_TABLET | Freq: Every day | ORAL | 3 refills | Status: DC

## 2023-10-02 NOTE — Telephone Encounter (Signed)
Rx was sent yesterday.   Contacted pt. Wife answered. Wife reports they already pick Rx yesterday. Went over gabapentin with her. Verbalized understanding.

## 2023-10-06 ENCOUNTER — Other Ambulatory Visit: Payer: Self-pay | Admitting: Family Medicine

## 2023-10-14 ENCOUNTER — Other Ambulatory Visit: Payer: Self-pay | Admitting: Family Medicine

## 2023-11-18 ENCOUNTER — Other Ambulatory Visit: Payer: Self-pay | Admitting: Family Medicine

## 2023-12-12 ENCOUNTER — Encounter (HOSPITAL_COMMUNITY): Payer: Self-pay | Admitting: *Deleted

## 2023-12-12 ENCOUNTER — Ambulatory Visit (HOSPITAL_COMMUNITY)
Admission: EM | Admit: 2023-12-12 | Discharge: 2023-12-12 | Disposition: A | Discharge status: Home / Self Care | Attending: Family Medicine | Admitting: Family Medicine

## 2023-12-12 ENCOUNTER — Ambulatory Visit: Payer: Self-pay

## 2023-12-12 ENCOUNTER — Other Ambulatory Visit: Payer: Self-pay

## 2023-12-12 DIAGNOSIS — M79661 Pain in right lower leg: Secondary | ICD-10-CM | POA: Diagnosis not present

## 2023-12-12 DIAGNOSIS — M7989 Other specified soft tissue disorders: Secondary | ICD-10-CM | POA: Diagnosis not present

## 2023-12-12 NOTE — Telephone Encounter (Signed)
 Information obtained from wife, Jerrold Morgan.   Chief Complaint: swelling to R calf Symptoms: tenderness, swelling,  Frequency: started within last couple days, noted yesterday that it was tender Pertinent Negatives: Patient denies fever, redness, SOB, CP Disposition: [] ED /[x] Urgent Care (no appt availability in office) / [] Appointment(In office/virtual)/ []  [facility]/ [] Home Care/ [] Refused Recommended Disposition /[] [facility]/ []  Follow-up with PCP Additional Notes: Wife states that he was starting with s/s 2 days ago, pt noted tenderness yesterday. Per wife calf is 1.25" larger on the R side. Wife denies any long trips by plane/car. Denies hx of DVT's. Denies SOB, denies CP. Pt advised to utilize UC d/t no appt in protocol timeframe. Wife agreeable.   Copied from CRM [id number]. Topic: Clinical - Red Word Triage >> Dec 12, 2023  8:02 AM Juluis Ok wrote: [facility] that prompted transfer to Nurse Triage: RT leg swelling Reason for Disposition  [1] Thigh or calf pain AND [2] only 1 side AND [3] present > 1 hour  Answer Assessment - Initial Assessment Questions 1. ONSET: "When did the swelling start?" (e.g., minutes, hours, days)     Yesterday was real tender 2. LOCATION: "What part of the leg is swollen?"  "Are both legs swollen or just one leg?"     Mid calf R side 3. SEVERITY: "How bad is the swelling?" (e.g., localized; mild, moderate, severe)   - Localized: Small area of swelling localized to one leg.   - MILD pedal edema: Swelling limited to foot and ankle, pitting edema < 1/4 inch (6 mm) deep, rest and elevation eliminate most or all swelling.   - MODERATE edema: Swelling of lower leg to knee, pitting edema > 1/4 inch (6 mm) deep, rest and elevation only partially reduce swelling.   - SEVERE edema: Swelling extends above knee, facial or hand swelling present.      Swelling is 1.25" on R side 4. REDNESS: "Does the swelling look red or infected?"     denies 5. PAIN:  "Is the swelling painful to touch?" If Yes, ask: "How painful is it?"   (Scale 1-10; mild, moderate or severe)     Only when palpated 6. FEVER: "Do you have a fever?" If Yes, ask: "What is it, how was it measured, and when did it start?"      denies 7. CAUSE: "What do you think is causing the leg swelling?"     unsure 8. MEDICAL HISTORY: "Do you have a history of blood clots (e.g., DVT), cancer, heart failure, kidney disease, or liver failure?"     denies 9. RECURRENT SYMPTOM: "Have you had leg swelling before?" If Yes, ask: "When was the last time?" "What happened that time?"     denies 10. OTHER SYMPTOMS: "Do you have any other symptoms?" (e.g., chest pain, difficulty breathing)       denies  Protocols used: Leg Swelling and Edema-A-AH

## 2023-12-12 NOTE — Discharge Instructions (Signed)
 We are sending you for a ultrasound of your right lower leg and will call you with any significant results.
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
## 2023-12-12 NOTE — ED Triage Notes (Signed)
 PT reports swelling and pain to RT calf fo r2 days.

## 2023-12-13 ENCOUNTER — Ambulatory Visit (HOSPITAL_COMMUNITY)
Admission: RE | Admit: 2023-12-13 | Discharge: 2023-12-13 | Disposition: A | Discharge status: Home / Self Care | Source: Ambulatory Visit | Attending: Family Medicine | Admitting: Family Medicine

## 2023-12-13 ENCOUNTER — Other Ambulatory Visit: Payer: Self-pay | Admitting: Family Medicine

## 2023-12-13 DIAGNOSIS — M79661 Pain in right lower leg: Secondary | ICD-10-CM | POA: Diagnosis not present

## 2023-12-13 DIAGNOSIS — M7989 Other specified soft tissue disorders: Secondary | ICD-10-CM | POA: Diagnosis not present

## 2023-12-16 NOTE — Telephone Encounter (Signed)
 Pt was seen at the ED on 12/12/23 for this problem

## 2023-12-24 DIAGNOSIS — Z96652 Presence of left artificial knee joint: Secondary | ICD-10-CM | POA: Diagnosis not present

## 2023-12-24 DIAGNOSIS — M25561 Pain in right knee: Secondary | ICD-10-CM | POA: Diagnosis not present

## 2023-12-24 DIAGNOSIS — M25562 Pain in left knee: Secondary | ICD-10-CM | POA: Diagnosis not present

## 2023-12-26 DIAGNOSIS — Z96652 Presence of left artificial knee joint: Secondary | ICD-10-CM | POA: Diagnosis not present

## 2023-12-31 ENCOUNTER — Other Ambulatory Visit (HOSPITAL_COMMUNITY): Payer: Self-pay

## 2023-12-31 DIAGNOSIS — Z96652 Presence of left artificial knee joint: Secondary | ICD-10-CM

## 2024-01-05 ENCOUNTER — Other Ambulatory Visit: Payer: Self-pay | Admitting: Family Medicine

## 2024-01-06 ENCOUNTER — Encounter: Payer: Self-pay | Admitting: Family Medicine

## 2024-01-07 DIAGNOSIS — I781 Nevus, non-neoplastic: Secondary | ICD-10-CM | POA: Diagnosis not present

## 2024-01-07 DIAGNOSIS — L573 Poikiloderma of Civatte: Secondary | ICD-10-CM | POA: Diagnosis not present

## 2024-01-11 ENCOUNTER — Other Ambulatory Visit: Payer: Self-pay | Admitting: Family Medicine

## 2024-01-14 ENCOUNTER — Other Ambulatory Visit: Payer: Self-pay

## 2024-01-14 ENCOUNTER — Encounter: Payer: Self-pay | Admitting: Family Medicine

## 2024-01-14 MED ORDER — OMEPRAZOLE 40 MG PO CPDR
40.0000 mg | DELAYED_RELEASE_CAPSULE | Freq: Every day | ORAL | 0 refills | Status: DC
Start: 2024-01-14 — End: 2024-04-09

## 2024-01-15 ENCOUNTER — Encounter (HOSPITAL_COMMUNITY)
Admission: RE | Admit: 2024-01-15 | Discharge: 2024-01-15 | Disposition: A | Discharge status: Home / Self Care | Source: Ambulatory Visit

## 2024-01-15 ENCOUNTER — Ambulatory Visit (HOSPITAL_COMMUNITY)
Admission: RE | Admit: 2024-01-15 | Discharge: 2024-01-15 | Disposition: A | Discharge status: Home / Self Care | Source: Ambulatory Visit

## 2024-01-15 DIAGNOSIS — Z96652 Presence of left artificial knee joint: Secondary | ICD-10-CM | POA: Insufficient documentation

## 2024-01-15 DIAGNOSIS — M25562 Pain in left knee: Secondary | ICD-10-CM | POA: Diagnosis not present

## 2024-01-15 MED ORDER — TECHNETIUM TC 99M MEDRONATE IV KIT
21.0000 | PACK | Freq: Once | INTRAVENOUS | Status: AC | PRN
  Administered 2024-01-15: 21 via INTRAVENOUS

## 2024-01-22 ENCOUNTER — Encounter: Payer: Self-pay | Admitting: Family Medicine

## 2024-01-22 ENCOUNTER — Ambulatory Visit (INDEPENDENT_AMBULATORY_CARE_PROVIDER_SITE_OTHER): Admitting: Family Medicine

## 2024-01-22 VITALS — BP 106/62 | HR 73 | Temp 97.9°F | Wt 209.0 lb

## 2024-01-22 DIAGNOSIS — G4730 Sleep apnea, unspecified: Secondary | ICD-10-CM

## 2024-01-22 DIAGNOSIS — I10 Essential (primary) hypertension: Secondary | ICD-10-CM

## 2024-01-22 NOTE — Progress Notes (Signed)
   Subjective:    Patient ID: Terry Holt, male    DOB: 03/09/1956, 68 y.o.   MRN: [id number]  HPI Here for 2 issues. First he has been feeling lightheaded for several weeks, especially when he stands up. His BP at home has been running low with systolic readings around 100. His weight and his diet have not changed, but he has been working out at the gym more than he used to. The other issue is possible sleep apnea. He does not notice anything, but his wife says he snores and he often seems to struggle to breathe at night.    Review of Systems  Constitutional: Negative.   Respiratory: Negative.    Cardiovascular: Negative.   Neurological:  Positive for light-headedness. Negative for dizziness.       Objective:   Physical Exam Constitutional:      Appearance: Normal appearance.  Cardiovascular:     Rate and Rhythm: Normal rate and regular rhythm.     Pulses: Normal pulses.     Heart sounds: Normal heart sounds.  Pulmonary:     Effort: Pulmonary effort is normal.     Breath sounds: Normal breath sounds.  Musculoskeletal:     Right lower leg: No edema.     Left lower leg: No edema.  Neurological:     Mental Status: He is alert.           Assessment & Plan:  He has been hypotensive lately, so we will stop the Amlodipine . He will follow his BP and will report back to us  in one week. He also likely has sleep apnea, so we will refer him to Pulmonology to evaluate. Corita Diego, MD

## 2024-02-02 ENCOUNTER — Encounter: Payer: Self-pay | Admitting: Family Medicine

## 2024-02-03 DIAGNOSIS — M1711 Unilateral primary osteoarthritis, right knee: Secondary | ICD-10-CM | POA: Diagnosis not present

## 2024-02-03 DIAGNOSIS — Z96652 Presence of left artificial knee joint: Secondary | ICD-10-CM | POA: Diagnosis not present

## 2024-02-03 NOTE — Telephone Encounter (Signed)
 We will stay off the Amlodipine , but I suggest increasing the Lisinopril  to 10 mg BID. Taking an evening dose should stop the high readings in the mornings. Call in Lisinopril  10 mg BID, #60 with 5 rf

## 2024-02-04 MED ORDER — LISINOPRIL 10 MG PO TABS: 10.0000 mg | ORAL_TABLET | Freq: Two times a day (BID) | ORAL | 5 refills | Status: DC

## 2024-03-06 DIAGNOSIS — M1711 Unilateral primary osteoarthritis, right knee: Secondary | ICD-10-CM | POA: Diagnosis not present

## 2024-03-06 DIAGNOSIS — Z96652 Presence of left artificial knee joint: Secondary | ICD-10-CM | POA: Diagnosis not present

## 2024-03-11 ENCOUNTER — Other Ambulatory Visit: Payer: Self-pay | Admitting: Family Medicine

## 2024-03-20 ENCOUNTER — Encounter (HOSPITAL_BASED_OUTPATIENT_CLINIC_OR_DEPARTMENT_OTHER): Payer: Self-pay | Admitting: Primary Care

## 2024-03-20 ENCOUNTER — Ambulatory Visit (HOSPITAL_BASED_OUTPATIENT_CLINIC_OR_DEPARTMENT_OTHER): Admitting: Primary Care

## 2024-03-20 VITALS — BP 154/82 | HR 69 | Ht 73.0 in | Wt 209.0 lb

## 2024-03-20 DIAGNOSIS — R0683 Snoring: Secondary | ICD-10-CM | POA: Diagnosis not present

## 2024-03-20 NOTE — Patient Instructions (Addendum)
 VISIT SUMMARY: Today, you were seen for concerns about possible sleep apnea. You reported symptoms such as snoring, waking up gasping or choking, restless sleep, and daytime fatigue. 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 We discussed the next steps to diagnose and manage your condition.  YOUR PLAN: -SUSPECTED OBSTRUCTIVE SLEEP APNEA: Obstructive sleep apnea is a condition where the airway becomes blocked during sleep, causing breathing to stop and start repeatedly. This can lead to restless sleep and daytime fatigue. We will order a home sleep study to evaluate your condition. Please avoid alcohol before bed as it can worsen sleep apnea. Follow your normal sleep routine during the study, including sleeping on your back. If sleep apnea is confirmed, potential treatments include CPAP therapy for moderate to severe cases and possibly oral appliance therapy. Surgery is rarely pursued and is not the first-line treatment.  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 This treatment may have also affected the muscle tone in your throat and neck, contributing to your suspected sleep apnea.  INSTRUCTIONS: Please complete the home sleep study as ordered. Follow your normal sleep routine during the study, including sleeping on your back. Avoid alcohol consumption before bed. We will discuss the results and potential treatment options at your follow-up appointment.  Follow-up Please send us  a mychart message or call 3 weeks after completing sleep study for results    Sleep Apnea  Sleep apnea is a condition that affects your breathing while you are sleeping. Your tongue or soft tissue in your  throat may block the flow of air while you sleep. You may have shallow breathing or stop breathing for short periods of time. People with sleep apnea may snore loudly. There are three kinds of sleep apnea: Obstructive sleep apnea. This kind is caused by a blocked or collapsed airway. This is the most common. Central sleep apnea. This kind happens when the part of the brain that controls breathing does not send the correct signals to the muscles that control breathing. Mixed sleep apnea. This is a combination of obstructive and central sleep apnea. What are the causes? The most common cause of sleep apnea is a collapsed or blocked airway. What increases the risk? Being very overweight. Having family members with sleep apnea. Having a tongue or tonsils that are larger than normal. Having a small airway or jaw problems. Being older. What are the signs or symptoms? Loud snoring. Restless sleep. Trouble staying asleep. Being sleepy or tired during the day. Waking up gasping or choking. Having a headache in the morning. Mood swings. Having a hard time remembering things and concentrating. How is this diagnosed? A medical history. A physical exam. A sleep study. This is also called a polysomnography test. This test is done at a sleep lab or in your home while you are sleeping. How is this treated? Treatment may include: Sleeping on your side. Losing weight if you're overweight. Wearing an oral appliance. This is a mouthpiece that moves your lower jaw forward. Using a positive airway pressure (PAP) device to keep your airways open while you sleep, such as: A continuous positive airway pressure (CPAP) device. This device gives forced air through a mask when you breathe out. This keeps your airways open. A bilevel  positive airway pressure (BIPAP) device. This device gives forced air through a mask when you breathe in and when you breathe out to keep your airways open. Having surgery if other  treatments do not work. If your sleep apnea is not treated, you may be at risk for: Heart failure. Heart attack. Stroke. Type 2 diabetes or a problem with your blood sugar called insulin resistance. Follow these instructions at home: Medicines Take your medicines only as told by your health care provider. Avoid alcohol, medicines to help you relax, and certain pain medicines. These may make sleep apnea worse. General instructions Do not smoke, vape, or use products with nicotine or tobacco in them. If you need help quitting, talk with your provider. If you were given a PAP device to open your airway while you sleep, use it as told by your provider. If you're having surgery, make sure to tell your provider you have sleep apnea. You may need to bring your PAP device with you. Contact a health care provider if: The PAP device that you were given to use during sleep bothers you or does not seem to be working. You do not feel better or you feel worse. Get help right away if: You have trouble breathing. You have chest pain. You have trouble talking. One side of your body feels weak. A part of your face is hanging down. These symptoms may be an emergency. Call 911 right away. Do not wait to see if the symptoms will go away. Do not drive yourself to the hospital. This information is not intended to replace advice given to you by your health care provider. Make sure you discuss any questions you have with your health care provider. Document Revised: 05/09/2023 Document Reviewed: 10/11/2022 Elsevier Patient Education  2024 [employer].

## 2024-03-20 NOTE — Progress Notes (Signed)
   Epworth Sleepiness Scale  Use the following scale to choose the most appropriate number for each situation. 0 Would never nod off 1  Slight  chance of nodding off 2 Moderate chance of nodding off 3 High chance of nodding off  Sitting and reading: 1 Watching TV: 0 Sitting, inactive, in a public place (e.g., in a meeting, theater, or dinner event): 0 As a passenger in a car for an hour or more without stopping for a break: 0 Lying down to rest when circumstances permit:1 Sitting and talking to someone: 0 Sitting quietly after a meal without alcohol: 0 In a car, while stopped for a few  minutes in traffic or at a light: 0  TOTOAL: 2
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
## 2024-03-30 ENCOUNTER — Encounter: Payer: Self-pay | Admitting: Family Medicine

## 2024-03-30 DIAGNOSIS — M7989 Other specified soft tissue disorders: Secondary | ICD-10-CM | POA: Diagnosis not present

## 2024-03-30 MED ORDER — TAMSULOSIN HCL 0.4 MG PO CAPS: 0.4000 mg | ORAL_CAPSULE | Freq: Every day | ORAL | 3 refills | Status: AC

## 2024-03-30 NOTE — Telephone Encounter (Signed)
 Done

## 2024-04-02 ENCOUNTER — Other Ambulatory Visit: Payer: Self-pay | Admitting: Family Medicine

## 2024-04-03 DIAGNOSIS — G4730 Sleep apnea, unspecified: Secondary | ICD-10-CM | POA: Diagnosis not present

## 2024-04-09 ENCOUNTER — Other Ambulatory Visit: Payer: Self-pay | Admitting: Family Medicine

## 2024-04-14 ENCOUNTER — Encounter: Payer: Self-pay | Admitting: Orthopedic Surgery

## 2024-04-14 DIAGNOSIS — M25561 Pain in right knee: Secondary | ICD-10-CM | POA: Diagnosis not present
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
## 2024-04-19 ENCOUNTER — Telehealth: Payer: Self-pay | Admitting: Pulmonary Disease

## 2024-04-19 NOTE — Telephone Encounter (Signed)
 Call patient  Sleep study result  Date of study: 04/03/2024  Impression: Moderate obstructive sleep apnea with moderately severe oxygen desaturations AHI of 19.9, O2 nadir of 70%, oxygen was below 88% for 22 minutes  Recommendation: DME referral  Recommend CPAP therapy for moderate obstructive sleep apnea.  Auto-titrating CPAP with pressure settings of 5-15 should be appropriate, along with heated humidification using the patient's preferred mask.  Encourage weight loss measures.  Schedule follow-up in the office 4 to 6 weeks after initiation. of treatment

## 2024-04-21 DIAGNOSIS — G4733 Obstructive sleep apnea (adult) (pediatric): Secondary | ICD-10-CM

## 2024-04-22 ENCOUNTER — Encounter: Payer: Self-pay | Admitting: Pulmonary Disease

## 2024-04-22 DIAGNOSIS — H5203 Hypermetropia, bilateral: Secondary | ICD-10-CM | POA: Diagnosis not present

## 2024-04-22 NOTE — Addendum Note (Signed)
 Addended by: MELVENIA WILFORD SAUNDERS on: 04/22/2024 09:14 AM   Modules accepted: Orders

## 2024-04-22 NOTE — Telephone Encounter (Signed)
 Handled via [employer]

## 2024-05-01 NOTE — Patient Instructions (Signed)
 SURGICAL WAITING ROOM VISITATION  Patients having surgery or a procedure may have no more than 2 support people in the waiting area - these visitors may rotate.    Children under the age of 64 must have an adult with them who is not the patient.  Visitors with respiratory illnesses are discouraged from visiting and should remain at home.  If the patient needs to stay at the hospital during part of their recovery, the visitor guidelines for inpatient rooms apply. Pre-op nurse will coordinate an appropriate time for 1 support person to accompany patient in pre-op.  This support person may not rotate.    Please refer to the [facility] website for the visitor guidelines for Inpatients (after your surgery is over and you are in a regular room).    Your procedure is scheduled on: 05/11/24   Report to [facility] Main Entrance    Report to admitting at 5:45 AM   Call this number if you have problems the morning of surgery [phone]   Do not eat food :After Midnight.   After Midnight you may have the following liquids until 8:15 AM DAY OF SURGERY  Water Non-Citrus Juices (without pulp, NO RED-Apple, White grape, White cranberry) Black Coffee (NO MILK/CREAM OR CREAMERS, sugar ok)  Clear Tea (NO MILK/CREAM OR CREAMERS, sugar ok) regular and decaf                             Plain Jell-O (NO RED)                                           Fruit ices (not with fruit pulp, NO RED)                                     Popsicles (NO RED)                                                               Sports drinks like Gatorade (NO RED)               The day of surgery:  Drink ONE (1) Pre-Surgery Clear Ensure at 5:15 AM the morning of surgery. Drink in one sitting. Do not sip.  This drink was given to you during your hospital  pre-op appointment visit. Nothing else to drink after completing the  Pre-Surgery Clear Ensure.          If you have questions, please contact your surgeon's  office.   FOLLOW BOWEL PREP AND ANY ADDITIONAL PRE OP INSTRUCTIONS YOU RECEIVED FROM YOUR SURGEON'S OFFICE!!!     Oral Hygiene is also important to reduce your risk of infection.                                    Remember - BRUSH YOUR TEETH THE MORNING OF SURGERY WITH YOUR REGULAR TOOTHPASTE  DENTURES WILL BE REMOVED PRIOR TO SURGERY PLEASE DO NOT APPLY Poly grip OR ADHESIVES!!!  Do NOT smoke after Midnight   Stop all vitamins and herbal supplements 7 days before surgery.   Take these medicines the morning of surgery with A SIP OF WATER: Atorvastatin , Levothyroxine , Omeprazole , Tamsulosin    DO NOT TAKE ANY ORAL DIABETIC MEDICATIONS DAY OF YOUR SURGERY  Bring CPAP mask and tubing day of surgery.                              You may not have any metal on your body including jewelry, and body piercing             Do not wear lotions, powders, cologne, or deodorant              Men may shave face and neck.   Do not bring valuables to the hospital. [facility] IS NOT             RESPONSIBLE   FOR VALUABLES.   Contacts, glasses, dentures or bridgework may not be worn into surgery.   Bring small overnight bag day of surgery.   DO NOT BRING YOUR HOME MEDICATIONS TO THE HOSPITAL. PHARMACY WILL DISPENSE MEDICATIONS LISTED ON YOUR MEDICATION LIST TO YOU DURING YOUR ADMISSION IN THE HOSPITAL!    Special Instructions: Bring a copy of your healthcare power of attorney and living will documents the day of surgery if you haven't scanned them before.              Please read over the following fact sheets you were given: IF YOU HAVE QUESTIONS ABOUT YOUR PRE-OP INSTRUCTIONS PLEASE CALL [phone] [place].   If you received a COVID test during your pre-op visit  it is requested that you wear a mask when out in public, stay away from anyone that may not be feeling well and notify your surgeon if you develop symptoms. If you test positive for Covid or have been in contact with anyone that  has tested positive in the last 10 days please notify you surgeon.      Pre-operative 5 CHG Bath Instructions   You can play a key role in reducing the risk of infection after surgery. Your skin needs to be as free of germs as possible. You can reduce the number of germs on your skin by washing with CHG (chlorhexidine  gluconate) soap before surgery. CHG is an antiseptic soap that kills germs and continues to kill germs even after washing.   DO NOT use if you have an allergy to chlorhexidine /CHG or antibacterial soaps. If your skin becomes reddened or irritated, stop using the CHG and notify one of our RNs at [phone].   Please shower with the CHG soap starting 4 days before surgery using the following schedule:     Please keep in mind the following:  DO NOT shave, including legs and underarms, starting the day of your first shower.   You may shave your face at any point before/day of surgery.  Place clean sheets on your bed the day you start using CHG soap. Use a clean washcloth (not used since being washed) for each shower. DO NOT sleep with pets once you start using the CHG.   CHG Shower Instructions:  If you choose to wash your hair and private area, wash first with your normal shampoo/soap.  After you use shampoo/soap, rinse your hair and body thoroughly to remove shampoo/soap residue.  Turn the water OFF and apply about 3 tablespoons (45 ml) of  CHG soap to a CLEAN washcloth.  Apply CHG soap ONLY FROM YOUR NECK DOWN TO YOUR TOES (washing for 3-5 minutes)  DO NOT use CHG soap on face, private areas, open wounds, or sores.  Pay special attention to the area where your surgery is being performed.  If you are having back surgery, having someone wash your back for you may be helpful. Wait 2 minutes after CHG soap is applied, then you may rinse off the CHG soap.  Pat dry with a clean towel  Put on clean clothes/pajamas   If you choose to wear lotion, please use ONLY the  CHG-compatible lotions on the back of this paper.     Additional instructions for the day of surgery: DO NOT APPLY any lotions, deodorants, cologne, or perfumes.   Put on clean/comfortable clothes.  Brush your teeth.  Ask your nurse before applying any prescription medications to the skin.      CHG Compatible Lotions   Aveeno Moisturizing lotion  Cetaphil Moisturizing Cream  Cetaphil Moisturizing Lotion  Clairol Herbal Essence Moisturizing Lotion, Dry Skin  Clairol Herbal Essence Moisturizing Lotion, Extra Dry Skin  Clairol Herbal Essence Moisturizing Lotion, Normal Skin  Curel Age Defying Therapeutic Moisturizing Lotion with Alpha Hydroxy  Curel Extreme Care Body Lotion  Curel Soothing Hands Moisturizing Hand Lotion  Curel Therapeutic Moisturizing Cream, Fragrance-Free  Curel Therapeutic Moisturizing Lotion, Fragrance-Free  Curel Therapeutic Moisturizing Lotion, Original Formula  Eucerin Daily Replenishing Lotion  Eucerin Dry Skin Therapy Plus Alpha Hydroxy Crme  Eucerin Dry Skin Therapy Plus Alpha Hydroxy Lotion  Eucerin Original Crme  Eucerin Original Lotion  Eucerin Plus Crme Eucerin Plus Lotion  Eucerin TriLipid Replenishing Lotion  Keri Anti-Bacterial Hand Lotion  Keri Deep Conditioning Original Lotion Dry Skin Formula Softly Scented  Keri Deep Conditioning Original Lotion, Fragrance Free Sensitive Skin Formula  Keri Lotion Fast Absorbing Fragrance Free Sensitive Skin Formula  Keri Lotion Fast Absorbing Softly Scented Dry Skin Formula  Keri Original Lotion  Keri Skin Renewal Lotion Keri Silky Smooth Lotion  Keri Silky Smooth Sensitive Skin Lotion  Nivea Body Creamy Conditioning Oil  Nivea Body Extra Enriched Lotion  Nivea Body Original Lotion  Nivea Body Sheer Moisturizing Lotion Nivea Crme  Nivea Skin Firming Lotion  NutraDerm 30 Skin Lotion  NutraDerm Skin Lotion  NutraDerm Therapeutic Skin Cream  NutraDerm Therapeutic Skin Lotion  ProShield Protective  Hand Cream  Provon moisturizing lotion   View Pre-Surgery Education Videos:  [email or web address]     Incentive Spirometer  An incentive spirometer is a tool that can help keep your lungs clear and active. This tool measures how well you are filling your lungs with each breath. Taking long deep breaths may help reverse or decrease the chance of developing breathing (pulmonary) problems (especially infection) following: A long period of time when you are unable to move or be active. BEFORE THE PROCEDURE  If the spirometer includes an indicator to show your best effort, your nurse or respiratory therapist will set it to a desired goal. If possible, sit up straight or lean slightly forward. Try not to slouch. Hold the incentive spirometer in an upright position. INSTRUCTIONS FOR USE  Sit on the edge of your bed if possible, or sit up as far as you can in bed or on a chair. Hold the incentive spirometer in an upright position. Breathe out normally. Place the mouthpiece in your mouth and seal your lips tightly around it. Breathe in slowly and as deeply as possible, raising the piston  or the ball toward the top of the column. Hold your breath for 3-5 seconds or for as long as possible. Allow the piston or ball to fall to the bottom of the column. Remove the mouthpiece from your mouth and breathe out normally. Rest for a few seconds and repeat Steps 1 through 7 at least 10 times every 1-2 hours when you are awake. Take your time and take a few normal breaths between deep breaths. The spirometer may include an indicator to show your best effort. Use the indicator as a goal to work toward during each repetition. After each set of 10 deep breaths, practice coughing to be sure your lungs are clear. If you have an incision (the cut made at the time of surgery), support your incision when coughing by placing a pillow or rolled up towels firmly  against it. Once you are able to get out of bed, walk around indoors and cough well. You may stop using the incentive spirometer when instructed by your caregiver.  RISKS AND COMPLICATIONS Take your time so you do not get dizzy or light-headed. If you are in pain, you may need to take or ask for pain medication before doing incentive spirometry. It is harder to take a deep breath if you are having pain. AFTER USE Rest and breathe slowly and easily. It can be helpful to keep track of a log of your progress. Your caregiver can provide you with a simple table to help with this. If you are using the spirometer at home, follow these instructions: SEEK MEDICAL CARE IF:  You are having difficultly using the spirometer. You have trouble using the spirometer as often as instructed. Your pain medication is not giving enough relief while using the spirometer. You develop fever of 100.5 F (38.1 C) or higher. SEEK IMMEDIATE MEDICAL CARE IF:  You cough up bloody sputum that had not been present before. You develop fever of 102 F (38.9 C) or greater. You develop worsening pain at or near the incision site. MAKE SURE YOU:  Understand these instructions. Will watch your condition. Will get help right away if you are not doing well or get worse. Document Released: 12/17/2006 Document Revised: 10/29/2011 Document Reviewed: 02/17/2007 [facility] Patient Information 2014 [place], [place].   ________________________________________________________________________

## 2024-05-04 ENCOUNTER — Other Ambulatory Visit: Payer: Self-pay

## 2024-05-04 ENCOUNTER — Encounter (HOSPITAL_COMMUNITY)
Admission: RE | Admit: 2024-05-04 | Discharge: 2024-05-04 | Disposition: A | Discharge status: Home / Self Care | Source: Ambulatory Visit | Attending: Orthopedic Surgery | Admitting: Orthopedic Surgery

## 2024-05-04 ENCOUNTER — Encounter (HOSPITAL_COMMUNITY): Payer: Self-pay

## 2024-05-04 VITALS — BP 145/74 | HR 72 | Temp 98.5°F | Resp 16 | Ht 73.0 in | Wt 206.0 lb

## 2024-05-04 DIAGNOSIS — I10 Essential (primary) hypertension: Secondary | ICD-10-CM | POA: Diagnosis not present

## 2024-05-04 DIAGNOSIS — Z01818 Encounter for other preprocedural examination: Secondary | ICD-10-CM | POA: Insufficient documentation

## 2024-05-04 HISTORY — DX: Unspecified osteoarthritis, unspecified site: M19.90

## 2024-05-04 HISTORY — DX: Sleep apnea, unspecified: G47.30

## 2024-05-04 HISTORY — DX: Hypothyroidism, unspecified: E03.9

## 2024-05-04 LAB — CBC
HCT: 43.9 % (ref 39.0–52.0)
Hemoglobin: 14.2 g/dL (ref 13.0–17.0)
MCH: 30.2 pg (ref 26.0–34.0)
MCHC: 32.3 g/dL (ref 30.0–36.0)
MCV: 93.4 fL (ref 80.0–100.0)
Platelets: 180 K/uL (ref 150–400)
RBC: 4.7 MIL/uL (ref 4.22–5.81)
RDW: 15.5 % (ref 11.5–15.5)
WBC: 6.2 K/uL (ref 4.0–10.5)
nRBC: 0 % (ref 0.0–0.2)

## 2024-05-04 LAB — BASIC METABOLIC PANEL WITH GFR
Anion gap: 11 (ref 5–15)
BUN: 18 mg/dL (ref 8–23)
CO2: 26 mmol/L (ref 22–32)
Calcium: 9.6 mg/dL (ref 8.9–10.3)
Chloride: 100 mmol/L (ref 98–111)
Creatinine, Ser: 0.92 mg/dL (ref 0.61–1.24)
GFR, Estimated: >60 mL/min (ref >60)
Glucose, Bld: 83 mg/dL (ref 70–99)
Potassium: 4.5 mmol/L (ref 3.5–5.1)
Sodium: 137 mmol/L (ref 135–145)

## 2024-05-04 LAB — SURGICAL PCR SCREEN
MRSA, PCR: NEGATIVE
Staphylococcus aureus: NEGATIVE

## 2024-05-04 NOTE — Progress Notes (Addendum)
 Anesthesia Review:  PCP: Garnette Reasoner LOV 01/22/24  Cardiologist : Croituri LVO 08/01/21 with Orren Lucien BARRE  Pulm- Almarie Ferrari , NP LOV 03/20/24   PPM/ ICD: Device Orders: Rep Notified:  Chest x-ray : EKG : 05/04/24  Echo : Stress test: 11/17/2014  Cardiac Cath :   Activity level: can do a flgiht of stairs without difficutly  Sleep Study/ CPAP : recent sleep study has not received cpap as of yet but if receives prior to surgery to bring mask and tubing  Fasting Blood Sugar :      / Checks Blood Sugar -- times a day:    Blood Thinner/ Instructions /Last Dose: ASA / Instructions/ Last Dose :

## 2024-05-05 ENCOUNTER — Encounter (HOSPITAL_COMMUNITY): Payer: Self-pay

## 2024-05-05 NOTE — Anesthesia Preprocedure Evaluation (Addendum)
 Anesthesia Evaluation  Patient identified by MRN, date of birth, ID band Patient awake    Reviewed: Allergy & Precautions, H&P , NPO status , Patient's Chart, lab work & pertinent test results  History of Anesthesia Complications (+) PONV and history of anesthetic complications  Airway Mallampati: III  TM Distance: >3 FB Neck ROM: Full    Dental  (+) Teeth Intact, Dental Advisory Given   Pulmonary sleep apnea (waiting for CPAP) , former smoker   Pulmonary exam normal breath sounds clear to auscultation       Cardiovascular hypertension, Pt. on medications Normal cardiovascular exam Rhythm:Regular Rate:Normal     Neuro/Psych negative neurological ROS  negative psych ROS   GI/Hepatic ,GERD  Controlled and Medicated,,(+)       alcohol use  Endo/Other  Hypothyroidism    Renal/GU negative Renal ROS  negative genitourinary   Musculoskeletal  (+) Arthritis , Osteoarthritis,    Abdominal   Peds negative pediatric ROS (+)  Hematology negative hematology ROS (+) Hb 14.2, plt 180   Anesthesia Other Findings tongue cancer s/p chemo/XRT (2016)  Reproductive/Obstetrics negative OB ROS                              Anesthesia Physical Anesthesia Plan  ASA: 3  Anesthesia Plan: Spinal, Regional and MAC   Post-op Pain Management: Regional block* and Tylenol  PO (pre-op)*   Induction:   PONV Risk Score and Plan: 2 and Propofol  infusion and TIVA  Airway Management Planned: Natural Airway and Nasal Cannula  Additional Equipment: None  Intra-op Plan:   Post-operative Plan:   Informed Consent: I have reviewed the patients History and Physical, chart, labs and discussed the procedure including the risks, benefits and alternatives for the proposed anesthesia with the patient or authorized representative who has indicated his/her understanding and acceptance.       Plan Discussed with:  CRNA  Anesthesia Plan Comments: (L TKR 2021 under spinal- only issue was urinary retention)         Anesthesia Quick Evaluation
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
## 2024-05-07 DIAGNOSIS — I10 Essential (primary) hypertension: Secondary | ICD-10-CM | POA: Diagnosis not present

## 2024-05-11 ENCOUNTER — Ambulatory Visit (HOSPITAL_COMMUNITY): Payer: Self-pay | Admitting: Medical

## 2024-05-11 ENCOUNTER — Ambulatory Visit (HOSPITAL_BASED_OUTPATIENT_CLINIC_OR_DEPARTMENT_OTHER): Admitting: Anesthesiology

## 2024-05-11 ENCOUNTER — Encounter (HOSPITAL_COMMUNITY): Payer: Self-pay | Admitting: Orthopedic Surgery

## 2024-05-11 ENCOUNTER — Encounter (HOSPITAL_COMMUNITY)
Admission: RE | Disposition: A | Discharge status: Home / Self Care | Payer: Self-pay | Source: Ambulatory Visit | Attending: Orthopedic Surgery

## 2024-05-11 ENCOUNTER — Other Ambulatory Visit: Payer: Self-pay

## 2024-05-11 ENCOUNTER — Observation Stay (HOSPITAL_COMMUNITY)
Admission: RE | Admit: 2024-05-11 | Discharge: 2024-05-12 | Disposition: A | Discharge status: Home / Self Care | Source: Ambulatory Visit | Attending: Orthopedic Surgery | Admitting: Orthopedic Surgery

## 2024-05-11 DIAGNOSIS — Z79899 Other long term (current) drug therapy: Secondary | ICD-10-CM | POA: Diagnosis not present

## 2024-05-11 DIAGNOSIS — E039 Hypothyroidism, unspecified: Secondary | ICD-10-CM | POA: Insufficient documentation

## 2024-05-11 DIAGNOSIS — M1711 Unilateral primary osteoarthritis, right knee: Secondary | ICD-10-CM | POA: Diagnosis not present

## 2024-05-11 DIAGNOSIS — Z7982 Long term (current) use of aspirin: Secondary | ICD-10-CM | POA: Insufficient documentation

## 2024-05-11 DIAGNOSIS — I10 Essential (primary) hypertension: Secondary | ICD-10-CM | POA: Diagnosis not present

## 2024-05-11 DIAGNOSIS — G8918 Other acute postprocedural pain: Secondary | ICD-10-CM | POA: Diagnosis not present

## 2024-05-11 DIAGNOSIS — Z87891 Personal history of nicotine dependence: Secondary | ICD-10-CM

## 2024-05-11 DIAGNOSIS — Z96652 Presence of left artificial knee joint: Secondary | ICD-10-CM | POA: Insufficient documentation

## 2024-05-11 DIAGNOSIS — Z85818 Personal history of malignant neoplasm of other sites of lip, oral cavity, and pharynx: Secondary | ICD-10-CM | POA: Diagnosis not present

## 2024-05-11 DIAGNOSIS — G4733 Obstructive sleep apnea (adult) (pediatric): Secondary | ICD-10-CM

## 2024-05-11 DIAGNOSIS — M179 Osteoarthritis of knee, unspecified: Principal | ICD-10-CM | POA: Diagnosis present

## 2024-05-11 DIAGNOSIS — M25561 Pain in right knee: Secondary | ICD-10-CM | POA: Diagnosis not present

## 2024-05-11 SURGERY — ARTHROPLASTY, KNEE, TOTAL
Anesthesia: Monitor Anesthesia Care | Site: Knee | Laterality: Right

## 2024-05-11 MED ORDER — PHENOL 1.4 % MT LIQD: 1.0000 | OROMUCOSAL | Status: DC | PRN

## 2024-05-11 MED ORDER — SODIUM CHLORIDE (PF) 0.9 % IJ SOLN
INTRAMUSCULAR | Status: DC | PRN
  Administered 2024-05-11: 60 mL

## 2024-05-11 MED ORDER — PHENYLEPHRINE HCL (PRESSORS) 10 MG/ML IV SOLN
INTRAVENOUS | Status: AC
  Filled 2024-05-11: qty 1

## 2024-05-11 MED ORDER — HYDROMORPHONE HCL 1 MG/ML IJ SOLN: 0.2500 mg | INTRAMUSCULAR | Status: DC | PRN

## 2024-05-11 MED ORDER — DOCUSATE SODIUM 100 MG PO CAPS
100.0000 mg | ORAL_CAPSULE | Freq: Two times a day (BID) | ORAL | Status: DC
  Administered 2024-05-11 – 2024-05-12 (×3): 100 mg via ORAL
  Filled 2024-05-11 (×3): qty 1

## 2024-05-11 MED ORDER — BUPIVACAINE IN DEXTROSE 0.75-8.25 % IT SOLN
INTRATHECAL | Status: DC | PRN
  Administered 2024-05-11: 1.6 mL via INTRATHECAL

## 2024-05-11 MED ORDER — ACETAMINOPHEN 500 MG PO TABS: 1000.0000 mg | ORAL_TABLET | Freq: Once | ORAL | Status: AC

## 2024-05-11 MED ORDER — OXYCODONE HCL 5 MG PO TABS: 5.0000 mg | ORAL_TABLET | Freq: Once | ORAL | Status: DC | PRN

## 2024-05-11 MED ORDER — ASPIRIN 81 MG PO CHEW
81.0000 mg | CHEWABLE_TABLET | Freq: Two times a day (BID) | ORAL | Status: DC
Start: 2024-05-12 — End: 2024-05-12
  Administered 2024-05-12: 81 mg via ORAL
  Filled 2024-05-11: qty 1

## 2024-05-11 MED ORDER — ONDANSETRON HCL 4 MG/2ML IJ SOLN: 4.0000 mg | Freq: Once | INTRAMUSCULAR | Status: DC | PRN

## 2024-05-11 MED ORDER — BISACODYL 10 MG RE SUPP: 10.0000 mg | Freq: Every day | RECTAL | Status: DC | PRN

## 2024-05-11 MED ORDER — ORAL CARE MOUTH RINSE: 15.0000 mL | OROMUCOSAL | Status: DC | PRN

## 2024-05-11 MED ORDER — DEXAMETHASONE SODIUM PHOSPHATE 10 MG/ML IJ SOLN
INTRAMUSCULAR | Status: AC
  Filled 2024-05-11: qty 1

## 2024-05-11 MED ORDER — MENTHOL 3 MG MT LOZG: 1.0000 | LOZENGE | OROMUCOSAL | Status: DC | PRN

## 2024-05-11 MED ORDER — SODIUM CHLORIDE 0.9 % IV SOLN: INTRAVENOUS | Status: DC

## 2024-05-11 MED ORDER — GABAPENTIN 300 MG PO CAPS
300.0000 mg | ORAL_CAPSULE | Freq: Three times a day (TID) | ORAL | Status: DC
  Administered 2024-05-11 – 2024-05-12 (×3): 300 mg via ORAL
  Filled 2024-05-11 (×3): qty 1

## 2024-05-11 MED ORDER — CEFAZOLIN SODIUM-DEXTROSE 2-4 GM/100ML-% IV SOLN
2.0000 g | INTRAVENOUS | Status: AC
Start: 2024-05-11 — End: 2024-05-11
  Administered 2024-05-11: 2 g via INTRAVENOUS
  Filled 2024-05-11: qty 100

## 2024-05-11 MED ORDER — TAMSULOSIN HCL 0.4 MG PO CAPS
0.4000 mg | ORAL_CAPSULE | Freq: Every day | ORAL | Status: DC
  Administered 2024-05-11: 0.4 mg via ORAL
  Filled 2024-05-11: qty 1

## 2024-05-11 MED ORDER — DIPHENHYDRAMINE HCL 12.5 MG/5ML PO ELIX: 12.5000 mg | ORAL_SOLUTION | ORAL | Status: DC | PRN

## 2024-05-11 MED ORDER — AMISULPRIDE (ANTIEMETIC) 5 MG/2ML IV SOLN: 10.0000 mg | Freq: Once | INTRAVENOUS | Status: DC | PRN

## 2024-05-11 MED ORDER — PROMETHAZINE HCL 25 MG PO TABS
25.0000 mg | ORAL_TABLET | Freq: Four times a day (QID) | ORAL | Status: DC | PRN
  Administered 2024-05-11: 25 mg via ORAL
  Filled 2024-05-11: qty 1

## 2024-05-11 MED ORDER — DEXAMETHASONE SODIUM PHOSPHATE 10 MG/ML IJ SOLN
10.0000 mg | Freq: Once | INTRAMUSCULAR | Status: AC
  Administered 2024-05-12: 10 mg via INTRAVENOUS
  Filled 2024-05-11: qty 1

## 2024-05-11 MED ORDER — METHOCARBAMOL 1000 MG/10ML IJ SOLN: 500.0000 mg | Freq: Four times a day (QID) | INTRAMUSCULAR | Status: DC | PRN

## 2024-05-11 MED ORDER — PROPOFOL 500 MG/50ML IV EMUL
INTRAVENOUS | Status: DC | PRN
  Administered 2024-05-11 (×2): 20 mg via INTRAVENOUS
  Administered 2024-05-11: 50 ug/kg/min via INTRAVENOUS
  Administered 2024-05-11: 30 mg via INTRAVENOUS

## 2024-05-11 MED ORDER — DEXAMETHASONE SODIUM PHOSPHATE 10 MG/ML IJ SOLN
8.0000 mg | Freq: Once | INTRAMUSCULAR | Status: AC
  Administered 2024-05-11: 10 mg via INTRAVENOUS

## 2024-05-11 MED ORDER — CEFAZOLIN SODIUM-DEXTROSE 2-4 GM/100ML-% IV SOLN
2.0000 g | Freq: Four times a day (QID) | INTRAVENOUS | Status: AC
  Administered 2024-05-11 (×2): 2 g via INTRAVENOUS
  Filled 2024-05-11 (×2): qty 100

## 2024-05-11 MED ORDER — LEVOTHYROXINE SODIUM 50 MCG PO TABS
50.0000 ug | ORAL_TABLET | Freq: Every day | ORAL | Status: DC
Start: 2024-05-12 — End: 2024-05-12
  Administered 2024-05-12: 50 ug via ORAL
  Filled 2024-05-11: qty 1

## 2024-05-11 MED ORDER — FENTANYL CITRATE (PF) 100 MCG/2ML IJ SOLN
INTRAMUSCULAR | Status: AC
  Filled 2024-05-11: qty 2

## 2024-05-11 MED ORDER — ONDANSETRON HCL 4 MG/2ML IJ SOLN
INTRAMUSCULAR | Status: AC
  Filled 2024-05-11: qty 2

## 2024-05-11 MED ORDER — MIDAZOLAM HCL 2 MG/2ML IJ SOLN
1.0000 mg | INTRAMUSCULAR | Status: DC
  Administered 2024-05-11: 2 mg via INTRAVENOUS
  Filled 2024-05-11: qty 2

## 2024-05-11 MED ORDER — ATORVASTATIN CALCIUM 20 MG PO TABS
20.0000 mg | ORAL_TABLET | Freq: Every day | ORAL | Status: DC
  Administered 2024-05-12: 20 mg via ORAL
  Filled 2024-05-11: qty 1

## 2024-05-11 MED ORDER — ACETAMINOPHEN 325 MG PO TABS: 325.0000 mg | ORAL_TABLET | Freq: Four times a day (QID) | ORAL | Status: DC | PRN

## 2024-05-11 MED ORDER — METOCLOPRAMIDE HCL 5 MG/ML IJ SOLN
5.0000 mg | Freq: Three times a day (TID) | INTRAMUSCULAR | Status: DC | PRN
  Administered 2024-05-11: 10 mg via INTRAVENOUS
  Filled 2024-05-11: qty 2

## 2024-05-11 MED ORDER — POVIDONE-IODINE 10 % EX SWAB: 2.0000 | Freq: Once | CUTANEOUS | Status: DC

## 2024-05-11 MED ORDER — TRAMADOL HCL 50 MG PO TABS
50.0000 mg | ORAL_TABLET | Freq: Four times a day (QID) | ORAL | Status: DC | PRN
  Administered 2024-05-11: 100 mg via ORAL
  Administered 2024-05-11 – 2024-05-12 (×2): 50 mg via ORAL
  Filled 2024-05-11: qty 2
  Filled 2024-05-11 (×2): qty 1

## 2024-05-11 MED ORDER — HYDROMORPHONE HCL 1 MG/ML IJ SOLN: 0.5000 mg | INTRAMUSCULAR | Status: DC | PRN

## 2024-05-11 MED ORDER — PHENYLEPHRINE HCL-NACL 20-0.9 MG/250ML-% IV SOLN
INTRAVENOUS | Status: DC | PRN
  Administered 2024-05-11 (×3): 80 ug via INTRAVENOUS
  Administered 2024-05-11: 40 ug/min via INTRAVENOUS
  Administered 2024-05-11 (×2): 80 ug via INTRAVENOUS

## 2024-05-11 MED ORDER — PROPOFOL 1000 MG/100ML IV EMUL
INTRAVENOUS | Status: AC
  Filled 2024-05-11: qty 100

## 2024-05-11 MED ORDER — ACETAMINOPHEN 500 MG PO TABS
1000.0000 mg | ORAL_TABLET | Freq: Four times a day (QID) | ORAL | Status: DC
  Administered 2024-05-11 – 2024-05-12 (×3): 1000 mg via ORAL
  Filled 2024-05-11 (×4): qty 2

## 2024-05-11 MED ORDER — FENTANYL CITRATE PF 50 MCG/ML IJ SOSY
50.0000 ug | PREFILLED_SYRINGE | INTRAMUSCULAR | Status: DC
  Administered 2024-05-11: 100 ug via INTRAVENOUS
  Filled 2024-05-11: qty 2

## 2024-05-11 MED ORDER — ONDANSETRON HCL 4 MG PO TABS: 4.0000 mg | ORAL_TABLET | Freq: Four times a day (QID) | ORAL | Status: DC | PRN

## 2024-05-11 MED ORDER — HYDROMORPHONE HCL 2 MG/ML IJ SOLN
INTRAMUSCULAR | Status: AC
  Filled 2024-05-11: qty 1

## 2024-05-11 MED ORDER — ONDANSETRON HCL 4 MG/2ML IJ SOLN
4.0000 mg | Freq: Four times a day (QID) | INTRAMUSCULAR | Status: DC | PRN
  Administered 2024-05-11: 4 mg via INTRAVENOUS
  Filled 2024-05-11: qty 2

## 2024-05-11 MED ORDER — DEXAMETHASONE SODIUM PHOSPHATE 10 MG/ML IJ SOLN
INTRAMUSCULAR | Status: DC | PRN
  Administered 2024-05-11: 10 mg via PERINEURAL

## 2024-05-11 MED ORDER — BUPIVACAINE LIPOSOME 1.3 % IJ SUSP
INTRAMUSCULAR | Status: AC
  Filled 2024-05-11: qty 20

## 2024-05-11 MED ORDER — TRANEXAMIC ACID-NACL 1000-0.7 MG/100ML-% IV SOLN
1000.0000 mg | INTRAVENOUS | Status: AC
Start: 2024-05-11 — End: 2024-05-11
  Administered 2024-05-11: 1000 mg via INTRAVENOUS
  Filled 2024-05-11: qty 100

## 2024-05-11 MED ORDER — SODIUM CHLORIDE (PF) 0.9 % IJ SOLN
INTRAMUSCULAR | Status: AC
  Filled 2024-05-11: qty 50

## 2024-05-11 MED ORDER — LACTATED RINGERS IV SOLN: INTRAVENOUS | Status: DC

## 2024-05-11 MED ORDER — POLYETHYLENE GLYCOL 3350 17 G PO PACK: 17.0000 g | PACK | Freq: Every day | ORAL | Status: DC | PRN

## 2024-05-11 MED ORDER — CHLORHEXIDINE GLUCONATE 0.12 % MT SOLN: 15.0000 mL | Freq: Once | OROMUCOSAL | Status: AC

## 2024-05-11 MED ORDER — ACETAMINOPHEN 10 MG/ML IV SOLN
1000.0000 mg | Freq: Four times a day (QID) | INTRAVENOUS | Status: DC
  Administered 2024-05-11: 1000 mg via INTRAVENOUS
  Filled 2024-05-11: qty 100

## 2024-05-11 MED ORDER — ROPIVACAINE HCL 5 MG/ML IJ SOLN
INTRAMUSCULAR | Status: DC | PRN
  Administered 2024-05-11: 30 mL via PERINEURAL

## 2024-05-11 MED ORDER — SODIUM CHLORIDE 0.9 % IR SOLN
Status: DC | PRN
  Administered 2024-05-11: 1000 mL

## 2024-05-11 MED ORDER — FENTANYL CITRATE (PF) 100 MCG/2ML IJ SOLN
INTRAMUSCULAR | Status: DC | PRN
  Administered 2024-05-11 (×4): 25 ug via INTRAVENOUS

## 2024-05-11 MED ORDER — METHOCARBAMOL 500 MG PO TABS
500.0000 mg | ORAL_TABLET | Freq: Four times a day (QID) | ORAL | Status: DC | PRN
  Administered 2024-05-11 (×2): 500 mg via ORAL
  Filled 2024-05-11 (×2): qty 1

## 2024-05-11 MED ORDER — HYDROMORPHONE HCL 1 MG/ML IJ SOLN
INTRAMUSCULAR | Status: DC | PRN
  Administered 2024-05-11 (×4): .5 mg via INTRAVENOUS

## 2024-05-11 MED ORDER — METOCLOPRAMIDE HCL 5 MG PO TABS: 5.0000 mg | ORAL_TABLET | Freq: Three times a day (TID) | ORAL | Status: DC | PRN

## 2024-05-11 MED ORDER — PANTOPRAZOLE SODIUM 40 MG PO TBEC
40.0000 mg | DELAYED_RELEASE_TABLET | Freq: Every day | ORAL | Status: DC
  Administered 2024-05-12: 40 mg via ORAL
  Filled 2024-05-11: qty 1

## 2024-05-11 MED ORDER — SODIUM CHLORIDE (PF) 0.9 % IJ SOLN
INTRAMUSCULAR | Status: AC
  Filled 2024-05-11: qty 10

## 2024-05-11 MED ORDER — OXYCODONE HCL 5 MG PO TABS
5.0000 mg | ORAL_TABLET | ORAL | Status: DC | PRN
  Administered 2024-05-11 – 2024-05-12 (×3): 5 mg via ORAL
  Filled 2024-05-11 (×3): qty 1

## 2024-05-11 MED ORDER — FLEET ENEMA RE ENEM: 1.0000 | ENEMA | Freq: Once | RECTAL | Status: DC | PRN

## 2024-05-11 MED ORDER — STERILE WATER FOR IRRIGATION IR SOLN
Status: DC | PRN
  Administered 2024-05-11: 2000 mL

## 2024-05-11 MED ORDER — BUPIVACAINE LIPOSOME 1.3 % IJ SUSP: 20.0000 mL | Freq: Once | INTRAMUSCULAR | Status: DC

## 2024-05-11 MED ORDER — 0.9 % SODIUM CHLORIDE (POUR BTL) OPTIME
TOPICAL | Status: DC | PRN
  Administered 2024-05-11: 1000 mL

## 2024-05-11 MED ORDER — BUPIVACAINE LIPOSOME 1.3 % IJ SUSP
INTRAMUSCULAR | Status: DC | PRN
  Administered 2024-05-11: 20 mL

## 2024-05-11 MED ORDER — ORAL CARE MOUTH RINSE
15.0000 mL | Freq: Once | OROMUCOSAL | Status: AC
  Administered 2024-05-11: 15 mL via OROMUCOSAL

## 2024-05-11 MED ORDER — PROPOFOL 500 MG/50ML IV EMUL
INTRAVENOUS | Status: AC
  Filled 2024-05-11: qty 50

## 2024-05-11 MED ORDER — OXYCODONE HCL 5 MG/5ML PO SOLN: 5.0000 mg | Freq: Once | ORAL | Status: DC | PRN

## 2024-05-11 SURGICAL SUPPLY — 44 items
ATTUNE MED DOME PAT 41 KNEE (Knees) IMPLANT
ATTUNE PS FEM RT SZ 8 CEM KNEE (Femur) IMPLANT
ATTUNE PSRP INSR SZ8 8 KNEE (Insert) IMPLANT
BAG COUNTER SPONGE SURGICOUNT (BAG) IMPLANT
BAG ZIPLOCK 12X15 (MISCELLANEOUS) ×1 IMPLANT
BASE TIBIAL ROT PLAT SZ 7 KNEE (Knees) IMPLANT
BLADE SAG 18X100X1.27 (BLADE) ×1 IMPLANT
BLADE SAW SGTL 11.0X1.19X90.0M (BLADE) ×1 IMPLANT
BNDG ELASTIC 6INX 5YD STR LF (GAUZE/BANDAGES/DRESSINGS) ×1 IMPLANT
BOWL SMART MIX CTS (DISPOSABLE) ×1 IMPLANT
CEMENT HV SMART SET (Cement) ×2 IMPLANT
COVER SURGICAL LIGHT HANDLE (MISCELLANEOUS) ×1 IMPLANT
CUFF TRNQT CYL 34X4.125X (TOURNIQUET CUFF) ×1 IMPLANT
DERMABOND ADVANCED .7 DNX12 (GAUZE/BANDAGES/DRESSINGS) ×1 IMPLANT
DRAPE U-SHAPE 47X51 STRL (DRAPES) ×1 IMPLANT
DRESSING AQUACEL AG SP 3.5X10 (GAUZE/BANDAGES/DRESSINGS) IMPLANT
DRSG AQUACEL AG ADV 3.5X10 (GAUZE/BANDAGES/DRESSINGS) ×1 IMPLANT
DURAPREP 26ML APPLICATOR (WOUND CARE) ×1 IMPLANT
ELECT REM PT RETURN 15FT ADLT (MISCELLANEOUS) ×1 IMPLANT
GLOVE BIO SURGEON STRL SZ 6.5 (GLOVE) IMPLANT
GLOVE BIO SURGEON STRL SZ7 (GLOVE) IMPLANT
GLOVE BIO SURGEON STRL SZ8 (GLOVE) ×1 IMPLANT
GLOVE BIOGEL PI IND STRL 7.0 (GLOVE) ×1 IMPLANT
GLOVE BIOGEL PI IND STRL 8 (GLOVE) ×1 IMPLANT
GOWN STRL REUS W/ TWL LRG LVL3 (GOWN DISPOSABLE) ×1 IMPLANT
HOLDER FOLEY CATH W/STRAP (MISCELLANEOUS) ×1 IMPLANT
IMMOBILIZER KNEE 20 THIGH 36 (SOFTGOODS) ×1 IMPLANT
KIT TURNOVER KIT A (KITS) ×1 IMPLANT
MANIFOLD NEPTUNE II (INSTRUMENTS) ×1 IMPLANT
NS IRRIG 1000ML POUR BTL (IV SOLUTION) ×1 IMPLANT
PACK TOTAL KNEE CUSTOM (KITS) ×1 IMPLANT
PADDING CAST COTTON 6X4 STRL (CAST SUPPLIES) ×2 IMPLANT
PENCIL SMOKE EVACUATOR (MISCELLANEOUS) ×1 IMPLANT
PIN STEINMAN FIXATION KNEE (PIN) IMPLANT
PROTECTOR NERVE ULNAR (MISCELLANEOUS) ×1 IMPLANT
SET HNDPC FAN SPRY TIP SCT (DISPOSABLE) ×1 IMPLANT
SUT MNCRL AB 4-0 PS2 18 (SUTURE) ×1 IMPLANT
SUT VIC AB 2-0 CT1 TAPERPNT 27 (SUTURE) ×3 IMPLANT
SUTURE STRATFX 0 PDS 27 VIOLET (SUTURE) ×1 IMPLANT
TOWEL GREEN STERILE FF (TOWEL DISPOSABLE) ×1 IMPLANT
TRAY FOLEY MTR SLVR 16FR STAT (SET/KITS/TRAYS/PACK) ×1 IMPLANT
TUBE SUCTION HIGH CAP CLEAR NV (SUCTIONS) ×1 IMPLANT
WATER STERILE IRR 1000ML POUR (IV SOLUTION) ×2 IMPLANT
WRAP KNEE MAXI GEL POST OP (GAUZE/BANDAGES/DRESSINGS) ×1 IMPLANT

## 2024-05-11 NOTE — Progress Notes (Signed)
 Orthopedic Tech Progress Note Patient Details:  Terry Holt Sep 08, 1955 [id number]  Patient ID: [name], male   DOB: 10/02/1955, 68 y.o.   MRN: [id number] Removed pt from CPM.  [name] 05/11/2024, 2:50 PM

## 2024-05-11 NOTE — Transfer of Care (Signed)
 Immediate Anesthesia Transfer of Care Note  Patient: Terry Holt  Procedure(s) Performed: Procedure(s): ARTHROPLASTY, KNEE, TOTAL (Right)  Patient Location: PACU  Anesthesia Type:General, Regional, and Spinal  Level of Consciousness: Patient easily awoken, comfortable, cooperative, following commands, responds to stimulation.   Airway & Oxygen Therapy: Patient spontaneously breathing, ventilating well, oxygen via simple oxygen mask.  Post-op Assessment: Report given to PACU RN, vital signs reviewed and stable, moving all extremities.   Post vital signs: Reviewed and stable.  Complications: No apparent anesthesia complications Last Vitals:  Vitals Value Taken Time  BP 117/75 05/11/24 10:06  Temp    Pulse 72 05/11/24 10:10  Resp 9 05/11/24 10:10  SpO2 100 % 05/11/24 10:10  Vitals shown include unfiled device data.  Last Pain:  Vitals:   05/11/24 0642  PainSc: 0-No pain         Complications: No notable events documented.

## 2024-05-11 NOTE — Progress Notes (Signed)
 Orthopedic Tech Progress Note Patient Details:  Terry Holt 1956-03-29 [id number] Applied CPM per order. Will remove at 2:40pm.  CPM Right Knee CPM Right Knee: On Right Knee Flexion (Degrees): 40 Right Knee Extension (Degrees): 10  Post Interventions Patient Tolerated: Well Instructions Provided: Adjustment of device, Care of device, Poper ambulation with device Ortho Devices Type of Ortho Device: CPM padding Ortho Device/Splint Location: RLE Ortho Device/Splint Interventions: Ordered, Application, Adjustment   Post Interventions Patient Tolerated: Well Instructions Provided: Adjustment of device, Care of device, Poper ambulation with device  [name] 05/11/2024, 10:56 AM

## 2024-05-11 NOTE — Anesthesia Procedure Notes (Signed)
 Spinal  Patient location during procedure: OR Start time: 05/11/2024 8:23 AM End time: 05/11/2024 8:25 AM Reason for block: surgical anesthesia Staffing Performed: anesthesiologist  Anesthesiologist: Merla Almarie HERO, DO Performed by: Merla Almarie HERO, DO Authorized by: Merla Almarie HERO, DO   Preanesthetic Checklist Completed: patient identified, IV checked, risks and benefits discussed, surgical consent, monitors and equipment checked, pre-op evaluation and timeout performed Spinal Block Patient position: sitting Prep: DuraPrep and site prepped and draped Patient monitoring: cardiac monitor, continuous pulse ox and blood pressure Approach: midline Location: L3-4 Injection technique: single-shot Needle Needle type: Pencan  Needle gauge: 24 G Needle length: 9 cm Assessment Sensory level: T6 Events: CSF return Additional Notes Functioning IV was confirmed and monitors were applied. Sterile prep and drape, including hand hygiene and sterile gloves were used. The patient was positioned and the spine was prepped. The skin was anesthetized with lidocaine .  Free flow of clear CSF was obtained prior to injecting local anesthetic into the CSF.  The spinal needle aspirated freely following injection.  The needle was carefully withdrawn.  The patient tolerated the procedure well.

## 2024-05-11 NOTE — Anesthesia Procedure Notes (Signed)
 Procedure Name: MAC Date/Time: 05/11/2024 8:23 AM  Performed by: Joshua Vernell BROCKS, CRNAPre-anesthesia Checklist: Patient identified, Emergency Drugs available, Suction available and Patient being monitored Patient Re-evaluated:Patient Re-evaluated prior to induction Oxygen Delivery Method: Simple face mask Placement Confirmation: positive ETCO2 and breath sounds checked- equal and bilateral Dental Injury: Teeth and Oropharynx as per pre-operative assessment

## 2024-05-11 NOTE — Op Note (Signed)
 OPERATIVE REPORT-TOTAL KNEE ARTHROPLASTY   Pre-operative diagnosis- Osteoarthritis  Right knee(s)  Post-operative diagnosis- Osteoarthritis Right knee(s)  Procedure-  Right  Total Knee Arthroplasty  Surgeon- Dempsey GAILS. Robley Matassa, MD  Assistant- Roxie Mess, PA-C   Anesthesia-  GA combined with regional for post-op pain  EBL-25 mL   Drains None  Tourniquet time-  Total Tourniquet Time Documented: Thigh (Right) - 43 minutes Total: Thigh (Right) - 43 minutes     Complications- None  Condition-PACU - hemodynamically stable.   Brief Clinical Note  Terry Holt is a 68 y.o. year old male with end stage OA of his right knee with progressively worsening pain and dysfunction. He has constant pain, with activity and at rest and significant functional deficits with difficulties even with ADLs. He has had extensive non-op management including analgesics, injections of cortisone and viscosupplements, and home exercise program, but remains in significant pain with significant dysfunction. Radiographs show bone on bone arthritis lateral and patellofemoral with valgus deformity. He presents now for right Total Knee Arthroplasty.     Procedure in detail---   The patient is brought into the operating room and positioned supine on the operating table. After successful administration of  GA combined with regional for post-op pain,   a tourniquet is placed high on the  Right thigh(s) and the lower extremity is prepped and draped in the usual sterile fashion. Time out is performed by the operating team and then the  Right lower extremity is wrapped in Esmarch, knee flexed and the tourniquet inflated to 300 mmHg.       A midline incision is made with a ten blade through the subcutaneous tissue to the level of the extensor mechanism. A fresh blade is used to make a medial parapatellar arthrotomy. Soft tissue over the proximal medial tibia is subperiosteally elevated to the joint line with a knife  and into the semimembranosus bursa with a Cobb elevator. Soft tissue over the proximal lateral tibia is elevated with attention being paid to avoiding the patellar tendon on the tibial tubercle. The patella is everted, knee flexed 90 degrees and the ACL and PCL are removed. Findings are bone on bone lateral and patellofemoral with large global osteophytes        The drill is used to create a starting hole in the distal femur and the canal is thoroughly irrigated with sterile saline to remove the fatty contents. The 5 degree Right  valgus alignment guide is placed into the femoral canal and the distal femoral cutting block is pinned to remove 10 mm off the distal femur. Resection is made with an oscillating saw.      The tibia is subluxed forward and the menisci are removed. The extramedullary alignment guide is placed referencing proximally at the medial aspect of the tibial tubercle and distally along the second metatarsal axis and tibial crest. The block is pinned to remove 2mm off the more deficient lateral  side. Resection is made with an oscillating saw. Size 7is the most appropriate size for the tibia and the proximal tibia is prepared with the modular drill and keel punch for that size.      The femoral sizing guide is placed and size 8 is most appropriate. Rotation is marked off the epicondylar axis and confirmed by creating a rectangular flexion gap at 90 degrees. The size 8 cutting block is pinned in this rotation and the anterior, posterior and chamfer cuts are made with the oscillating saw. The intercondylar block is  then placed and that cut is made.      Trial size 7 tibial component, trial size 8 posterior stabilized femur and a 8  mm posterior stabilized rotating platform insert trial is placed. Full extension is achieved with excellent varus/valgus and anterior/posterior balance throughout full range of motion. The patella is everted and thickness measured to be 24  mm. Free hand resection is  taken to 12 mm, a 41 template is placed, lug holes are drilled, trial patella is placed, and it tracks normally. Osteophytes are removed off the posterior femur with the trial in place. All trials are removed and the cut bone surfaces prepared with pulsatile lavage. Cement is mixed and once ready for implantation, the size 7 tibial implant, size  8 posterior stabilized femoral component, and the size 41 patella are cemented in place and the patella is held with the clamp. The trial insert is placed and the knee held in full extension. The Exparel  (20 ml mixed with 60 ml saline) is injected into the extensor mechanism, posterior capsule, medial and lateral gutters and subcutaneous tissues.  All extruded cement is removed and once the cement is hard the permanent 8 mm posterior stabilized rotating platform insert is placed into the tibial tray.      The wound is copiously irrigated with saline solution and the extensor mechanism closed with # 0 Stratofix suture. The tourniquet is released for a total tourniquet time of 43  minutes. Flexion against gravity is 140 degrees and the patella tracks normally. Subcutaneous tissue is closed with 2.0 vicryl and subcuticular with running 4.0 Monocryl. The incision is cleaned and dried and steri-strips and a bulky sterile dressing are applied. The limb is placed into a knee immobilizer and the patient is awakened and transported to recovery in stable condition.      Please note that a surgical assistant was a medical necessity for this procedure in order to perform it in a safe and expeditious manner. Surgical assistant was necessary to retract the ligaments and vital neurovascular structures to prevent injury to them and also necessary for proper positioning of the limb to allow for anatomic placement of the prosthesis.   Dempsey ROCKFORD Wilene Pharo, MD    05/11/2024, 9:38 AM

## 2024-05-11 NOTE — Anesthesia Procedure Notes (Signed)
 Procedure Name: LMA Insertion Date/Time: 05/11/2024 8:53 AM  Performed by: Joshua Vernell BROCKS, CRNAPre-anesthesia Checklist: Patient identified, Emergency Drugs available, Suction available and Patient being monitored Patient Re-evaluated:Patient Re-evaluated prior to induction Oxygen Delivery Method: Circle system utilized Preoxygenation: Pre-oxygenation with 100% oxygen Induction Type: IV induction Ventilation: Mask ventilation without difficulty LMA Size: 5.0 Number of attempts: 1 Placement Confirmation: positive ETCO2 and breath sounds checked- equal and bilateral Tube secured with: Tape Dental Injury: Teeth and Oropharynx as per pre-operative assessment

## 2024-05-11 NOTE — Evaluation (Signed)
 Physical Therapy Evaluation Patient Details Name: Terry Holt MRN: [id number] DOB: 09-08-1955 Today's Date: 05/11/2024  History of Present Illness  Pt is 68 yo male s/p R TKA on 05/11/24.  Pt with hx including but not limited to overactive bladder, OA, tongue CA, HTN, HPV, L TKA 2021  Clinical Impression  Pt is s/p TKA resulting in the deficits listed below (see PT Problem List). At baseline, pt active and independent.  Pt has home support and DME.  Today, pt with good ROM, quad activation, and pain control.  Pt mainly limited by N and V (hx of PONV with general anesthesia, pt with failed spinal today) but still able to ambulate 100' with CGA and RW.  Pt expected to progress well.  Pt will benefit from acute skilled PT to increase their independence and safety with mobility to allow discharge.          If plan is discharge home, recommend the following: A little help with walking and/or transfers;Assistance with cooking/housework;A little help with bathing/dressing/bathroom;Help with stairs or ramp for entrance   Can travel by private vehicle        Equipment Recommendations None recommended by PT  Recommendations for Other Services       Functional Status Assessment Patient has had a recent decline in their functional status and demonstrates the ability to make significant improvements in function in a reasonable and predictable amount of time.     Precautions / Restrictions Precautions Precautions: Fall;Knee Required Braces or Orthoses: Knee Immobilizer - Right Knee Immobilizer - Right: Discontinue once straight leg raise with < 10 degree lag Restrictions Weight Bearing Restrictions Per Provider Order: Yes RLE Weight Bearing Per Provider Order: Weight bearing as tolerated      Mobility  Bed Mobility Overal bed mobility: Needs Assistance Bed Mobility: Supine to Sit, Sit to Supine     Supine to sit: Supervision Sit to supine: Supervision        Transfers Overall  transfer level: Needs assistance Equipment used: Rolling walker (2 wheels) Transfers: Sit to/from Stand Sit to Stand: Contact guard assist           General transfer comment: CGA safety; cues for R LE management    Ambulation/Gait Ambulation/Gait assistance: Contact guard assist Gait Distance (Feet): 80 Feet Assistive device: Rolling walker (2 wheels) Gait Pattern/deviations: Step-through pattern, Decreased stride length, Decreased weight shift to right       General Gait Details: Slight decrease in weight shift R; tolerated well; good speed for DOS  Stairs            Wheelchair Mobility     Tilt Bed    Modified Rankin (Stroke Patients Only)       Balance Overall balance assessment: Needs assistance Sitting-balance support: No upper extremity supported Sitting balance-Leahy Scale: Good     Standing balance support: Bilateral upper extremity supported, Reliant on assistive device for balance Standing balance-Leahy Scale: Poor Standing balance comment: steady with RW                             Pertinent Vitals/Pain Pain Assessment Pain Assessment: 0-10 Pain Score: 4  Pain Location: R knee Pain Descriptors / Indicators: Discomfort Pain Intervention(s): Limited activity within patient's tolerance, Monitored during session, Premedicated before session, Repositioned, Ice applied    Home Living Family/patient expects to be discharged to:: Private residence Living Arrangements: Spouse/significant other Available Help at Discharge: Family;Available 24 hours/day Type of  Home: House Home Access: Stairs to enter Entrance Stairs-Rails: Right Entrance Stairs-Number of Steps: 2   Home Layout: Two level;Able to live on main level with bedroom/bathroom Home Equipment: Shower seat - built in;Grab bars - tub/shower;Rolling Walker (2 wheels);Cane - single point;BSC/3in1;Wheelchair - manual      Prior Function Prior Level of Function :  Independent/Modified Independent;Working/employed;Driving             Mobility Comments: ambulating without AD       Extremity/Trunk Assessment   Upper Extremity Assessment Upper Extremity Assessment: Overall WFL for tasks assessed    Lower Extremity Assessment Lower Extremity Assessment: LLE deficits/detail;RLE deficits/detail RLE Deficits / Details: Expected post op changes; ROM ~5 to 80 degrees; MMT ankle 5/5, knee and hip 3/5 not further tested; able to SLR LLE Deficits / Details: ROM WFL; MMT 5/5    Cervical / Trunk Assessment Cervical / Trunk Assessment: Normal  Communication        Cognition Arousal: Alert Behavior During Therapy: WFL for tasks assessed/performed   PT - Cognitive impairments: No apparent impairments                                 Cueing       General Comments General comments (skin integrity, edema, etc.): Pt with h/o PONV with general anesthesia - pt vomitied 500 mL liquid during session , notified RN    Exercises     Assessment/Plan    PT Assessment Patient needs continued PT services  PT Problem List Decreased strength;Decreased mobility;Decreased activity tolerance;Decreased range of motion;Decreased balance;Decreased knowledge of use of DME;Pain       PT Treatment Interventions Therapeutic exercise;DME instruction;Stair training;Functional mobility training;Patient/family education;Therapeutic activities;Modalities;Balance training;Gait training    PT Goals (Current goals can be found in the Care Plan section)  Acute Rehab PT Goals Patient Stated Goal: return home PT Goal Formulation: With patient/family Time For Goal Achievement: 05/25/24 Potential to Achieve Goals: Good    Frequency 7X/week     Co-evaluation               AM-PAC PT 6 Clicks Mobility  Outcome Measure Help needed turning from your back to your side while in a flat bed without using bedrails?: A Little Help needed moving from lying  on your back to sitting on the side of a flat bed without using bedrails?: A Little Help needed moving to and from a bed to a chair (including a wheelchair)?: A Little Help needed standing up from a chair using your arms (e.g., wheelchair or bedside chair)?: A Little Help needed to walk in hospital room?: A Little Help needed climbing 3-5 steps with a railing? : A Little 6 Click Score: 18    End of Session Equipment Utilized During Treatment: Gait belt Activity Tolerance: Patient tolerated treatment well Patient left: with call bell/phone within reach;in bed;with bed alarm set;with SCD's reapplied Nurse Communication: Mobility status PT Visit Diagnosis: Muscle weakness (generalized) (M62.81);Other abnormalities of gait and mobility (R26.89)    Time: 8454-8393 PT Time Calculation (min) (ACUTE ONLY): 21 min   Charges:   PT Evaluation $PT Eval Low Complexity: 1 Low   PT General Charges $$ ACUTE PT VISIT: 1 Visit         Terry, PT Acute Rehab [facility] Rehab [phone]   Terry Holt 05/11/2024, 4:16 PM

## 2024-05-11 NOTE — Interval H&P Note (Signed)
 History and Physical Interval Note:  05/11/2024 6:25 AM  Terry Holt  has presented today for surgery, with the diagnosis of Right knee osteoarthritis.  The various methods of treatment have been discussed with the patient and family. After consideration of risks, benefits and other options for treatment, the patient has consented to  Procedure(s): ARTHROPLASTY, KNEE, TOTAL (Right) as a surgical intervention.  The patient's history has been reviewed, patient examined, no change in status, stable for surgery.  I have reviewed the patient's chart and labs.  Questions were answered to the patient's satisfaction.     Terry Holt

## 2024-05-11 NOTE — Discharge Instructions (Signed)
 Frank Aluisio, MD Total Joint Specialist [facility] [address] [place], [place] [address] [phone]  TOTAL KNEE REPLACEMENT POSTOPERATIVE DIRECTIONS    Knee Rehabilitation, Guidelines Following Surgery  Results after knee surgery are often greatly improved when you follow the exercise, range of motion and muscle strengthening exercises prescribed by your doctor. Safety measures are also important to protect the knee from further injury. If any of these exercises cause you to have increased pain or swelling in your knee joint, decrease the amount until you are comfortable again and slowly increase them. If you have problems or questions, call your caregiver or physical therapist for advice.   BLOOD CLOT PREVENTION Take an 81 mg Aspirin two times a day for three weeks following surgery. Then resume one 81 mg Aspirin once a day. You may resume your vitamins/supplements upon discharge from the hospital. Do not take any NSAIDs (Advil, Aleve, Ibuprofen, Meloxicam, etc.) until you are 3 weeks out from surgery  HOME CARE INSTRUCTIONS  Remove items at home which could result in a fall. This includes throw rugs or furniture in walking pathways.  ICE to the affected knee as much as tolerated. Icing helps control swelling. If the swelling is well controlled you will be more comfortable and rehab easier. Continue to use ice on the knee for pain and swelling from surgery. You may notice swelling that will progress down to the foot and ankle. This is normal after surgery. Elevate the leg when you are not up walking on it.    Continue to use the breathing machine which will help keep your temperature down. It is common for your temperature to cycle up and down following surgery, especially at night when you are not up moving around and exerting yourself. The breathing machine keeps your lungs expanded and your temperature down. Do not place pillow under the operative  knee, focus on keeping the knee straight while resting  DIET You may resume your previous home diet once you are discharged from the hospital.  DRESSING / WOUND CARE / SHOWERING Keep your bulky bandage on for 2 days. On the third post-operative day you may remove the Ace bandage and gauze. There is a waterproof adhesive bandage on your skin which will stay in place until your first follow-up appointment. Once you remove this you will not need to place another bandage You may begin showering 3 days following surgery, but do not submerge the incision under water.  ACTIVITY For the first 5 days, the key is rest and control of pain and swelling Do your home exercises twice a day starting on post-operative day 3. On the days you go to physical therapy, just do the home exercises once that day. You should rest, ice and elevate the leg for 50 minutes out of every hour. Get up and walk/stretch for 10 minutes per hour. After 5 days you can increase your activity slowly as tolerated. Walk with your walker as instructed. Use the walker until you are comfortable transitioning to a cane. Walk with the cane in the opposite hand of the operative leg. You may discontinue the cane once you are comfortable and walking steadily. Avoid periods of inactivity such as sitting longer than an hour when not asleep. This helps prevent blood clots.  You may discontinue the knee immobilizer once you are able to perform a straight leg raise while lying down. You may resume a sexual relationship in one month or when given the OK by   your doctor.  You may return to work once you are cleared by your doctor.  Do not drive a car for 6 weeks or until released by your surgeon.  Do not drive while taking narcotics.  TED HOSE STOCKINGS Wear the elastic stockings on both legs for three weeks following surgery during the day. You may remove them at night for sleeping.  WEIGHT BEARING Weight bearing as tolerated with assist device  (walker, cane, etc) as directed, use it as long as suggested by your surgeon or therapist, typically at least 4-6 weeks.  POSTOPERATIVE CONSTIPATION PROTOCOL Constipation - defined medically as fewer than three stools per week and severe constipation as less than one stool per week.  One of the most common issues patients have following surgery is constipation.  Even if you have a regular bowel pattern at home, your normal regimen is likely to be disrupted due to multiple reasons following surgery.  Combination of anesthesia, postoperative narcotics, change in appetite and fluid intake all can affect your bowels.  In order to avoid complications following surgery, here are some recommendations in order to help you during your recovery period.  Colace (docusate) - Pick up an over-the-counter form of Colace or another stool softener and take twice a day as long as you are requiring postoperative pain medications.  Take with a full glass of water daily.  If you experience loose stools or diarrhea, hold the colace until you stool forms back up. If your symptoms do not get better within 1 week or if they get worse, check with your doctor. Dulcolax (bisacodyl) - Pick up over-the-counter and take as directed by the product packaging as needed to assist with the movement of your bowels.  Take with a full glass of water.  Use this product as needed if not relieved by Colace only.  MiraLax (polyethylene glycol) - Pick up over-the-counter to have on hand. MiraLax is a solution that will increase the amount of water in your bowels to assist with bowel movements.  Take as directed and can mix with a glass of water, juice, soda, coffee, or tea. Take if you go more than two days without a movement. Do not use MiraLax more than once per day. Call your doctor if you are still constipated or irregular after using this medication for 7 days in a row.  If you continue to have problems with postoperative constipation, please  contact the office for further assistance and recommendations.  If you experience "the worst abdominal pain ever" or develop nausea or vomiting, please contact the office immediatly for further recommendations for treatment.  ITCHING If you experience itching with your medications, try taking only a single pain pill, or even half a pain pill at a time.  You can also use Benadryl over the counter for itching or also to help with sleep.   MEDICATIONS See your medication summary on the "After Visit Summary" that the nursing staff will review with you prior to discharge.  You may have some home medications which will be placed on hold until you complete the course of blood thinner medication.  It is important for you to complete the blood thinner medication as prescribed by your surgeon.  Continue your approved medications as instructed at time of discharge.  PRECAUTIONS If you experience chest pain or shortness of breath - call 911 immediately for transfer to the hospital emergency department.  If you develop a fever greater that 101 F, purulent drainage from wound, increased redness   or drainage from wound, foul odor from the wound/dressing, or calf pain - CONTACT YOUR SURGEON.                                                   FOLLOW-UP APPOINTMENTS Make sure you keep all of your appointments after your operation with your surgeon and caregivers. You should call the office at the above phone number and make an appointment for approximately two weeks after the date of your surgery or on the date instructed by your surgeon outlined in the "After Visit Summary".  RANGE OF MOTION AND STRENGTHENING EXERCISES  Rehabilitation of the knee is important following a knee injury or an operation. After just a few days of immobilization, the muscles of the thigh which control the knee become weakened and shrink (atrophy). Knee exercises are designed to build up the tone and strength of the thigh muscles and to  improve knee motion. Often times heat used for twenty to thirty minutes before working out will loosen up your tissues and help with improving the range of motion but do not use heat for the first two weeks following surgery. These exercises can be done on a training (exercise) mat, on the floor, on a table or on a bed. Use what ever works the best and is most comfortable for you Knee exercises include:  Leg Lifts - While your knee is still immobilized in a splint or cast, you can do straight leg raises. Lift the leg to 60 degrees, hold for 3 sec, and slowly lower the leg. Repeat 10-20 times 2-3 times daily. Perform this exercise against resistance later as your knee gets better.  Quad and Hamstring Sets - Tighten up the muscle on the front of the thigh (Quad) and hold for 5-10 sec. Repeat this 10-20 times hourly. Hamstring sets are done by pushing the foot backward against an object and holding for 5-10 sec. Repeat as with quad sets.  Leg Slides: Lying on your back, slowly slide your foot toward your buttocks, bending your knee up off the floor (only go as far as is comfortable). Then slowly slide your foot back down until your leg is flat on the floor again. Angel Wings: Lying on your back spread your legs to the side as far apart as you can without causing discomfort.  A rehabilitation program following serious knee injuries can speed recovery and prevent re-injury in the future due to weakened muscles. Contact your doctor or a physical therapist for more information on knee rehabilitation.   POST-OPERATIVE OPIOID TAPER INSTRUCTIONS: It is important to wean off of your opioid medication as soon as possible. If you do not need pain medication after your surgery it is ok to stop day one. Opioids include: Codeine, Hydrocodone(Norco, Vicodin), Oxycodone(Percocet, oxycontin) and hydromorphone amongst others.  Long term and even short term use of opiods can cause: Increased pain  response Dependence Constipation Depression Respiratory depression And more.  Withdrawal symptoms can include Flu like symptoms Nausea, vomiting And more Techniques to manage these symptoms Hydrate well Eat regular healthy meals Stay active Use relaxation techniques(deep breathing, meditating, yoga) Do Not substitute Alcohol to help with tapering If you have been on opioids for less than two weeks and do not have pain than it is ok to stop all together.  Plan to wean off of opioids This   plan should start within one week post op of your joint replacement. Maintain the same interval or time between taking each dose and first decrease the dose.  Cut the total daily intake of opioids by one tablet each day Next start to increase the time between doses. The last dose that should be eliminated is the evening dose.   IF YOU ARE TRANSFERRED TO A SKILLED REHAB FACILITY If the patient is transferred to a skilled rehab facility following release from the hospital, a list of the current medications will be sent to the facility for the patient to continue.  When discharged from the skilled rehab facility, please have the facility set up the patient's Home Health Physical Therapy prior to being released. Also, the skilled facility will be responsible for providing the patient with their medications at time of release from the facility to include their pain medication, the muscle relaxants, and their blood thinner medication. If the patient is still at the rehab facility at time of the two week follow up appointment, the skilled rehab facility will also need to assist the patient in arranging follow up appointment in our office and any transportation needs.  MAKE SURE YOU:  Understand these instructions.  Get help right away if you are not doing well or get worse.   DENTAL ANTIBIOTICS:  In most cases prophylactic antibiotics for Dental procdeures after total joint surgery are not  necessary.  Exceptions are as follows:  1. History of prior total joint infection  2. Severely immunocompromised (Organ Transplant, cancer chemotherapy, Rheumatoid biologic meds such as Humera)  3. Poorly controlled diabetes (A1C &gt; 8.0, blood glucose over 200)  If you have one of these conditions, contact your surgeon for an antibiotic prescription, prior to your dental procedure.    Pick up stool softner and laxative for home use following surgery while on pain medications. Do not submerge incision under water. Please use good hand washing techniques while changing dressing each day. May shower starting three days after surgery. Please use a clean towel to pat the incision dry following showers. Continue to use ice for pain and swelling after surgery. Do not use any lotions or creams on the incision until instructed by your surgeon.  

## 2024-05-11 NOTE — Anesthesia Procedure Notes (Signed)
 Anesthesia Regional Block: Adductor canal block   Pre-Anesthetic Checklist: , timeout performed,  Correct Patient, Correct Site, Correct Laterality,  Correct Procedure, Correct Position, site marked,  Risks and benefits discussed,  Surgical consent,  Pre-op evaluation,  At surgeon's request and post-op pain management  Laterality: Right  Prep: Maximum Sterile Barrier Precautions used, chloraprep       Needles:  Injection technique: Single-shot  Needle Type: Echogenic Stimulator Needle     Needle Length: 9cm  Needle Gauge: 22     Additional Needles:   Procedures:,,,, ultrasound used (permanent image in chart),,    Narrative:  Start time: 05/11/2024 7:55 AM End time: 05/11/2024 8:00 AM Injection made incrementally with aspirations every 5 mL.  Performed by: Personally  Anesthesiologist: Merla Almarie HERO, DO  Additional Notes: Monitors applied. No increased pain on injection. No increased resistance to injection. Injection made in 5cc increments. Good needle visualization. Patient tolerated procedure well.

## 2024-05-11 NOTE — Anesthesia Postprocedure Evaluation (Signed)
 Anesthesia Post Note  Patient: Terry Holt  Procedure(s) Performed: ARTHROPLASTY, KNEE, TOTAL (Right: Knee)     Patient location during evaluation: PACU Anesthesia Type: General Level of consciousness: awake and alert, oriented and patient cooperative Pain management: pain level controlled Vital Signs Assessment: post-procedure vital signs reviewed and stable Respiratory status: spontaneous breathing, nonlabored ventilation and respiratory function stable Cardiovascular status: blood pressure returned to baseline and stable Postop Assessment: no apparent nausea or vomiting Anesthetic complications: no Comments: Failed spinal    No notable events documented.  Last Vitals:  Vitals:   05/11/24 1057 05/11/24 1058  BP:    Pulse:  72  Resp:  13  Temp: (!) 36 C   SpO2:  100%    Last Pain:  Vitals:   05/11/24 1030  PainSc: 1     LLE Motor Response: Purposeful movement (05/11/24 1058) LLE Sensation: Full sensation (05/11/24 1058) RLE Motor Response: Purposeful movement (05/11/24 1058) RLE Sensation: Decreased (05/11/24 1058)      Almarie HERO [place]

## 2024-05-12 ENCOUNTER — Other Ambulatory Visit (HOSPITAL_COMMUNITY): Payer: Self-pay

## 2024-05-12 ENCOUNTER — Encounter (HOSPITAL_COMMUNITY): Payer: Self-pay | Admitting: Orthopedic Surgery

## 2024-05-12 ENCOUNTER — Other Ambulatory Visit: Payer: Self-pay

## 2024-05-12 DIAGNOSIS — Z79899 Other long term (current) drug therapy: Secondary | ICD-10-CM | POA: Diagnosis not present

## 2024-05-12 DIAGNOSIS — Z87891 Personal history of nicotine dependence: Secondary | ICD-10-CM | POA: Diagnosis not present

## 2024-05-12 DIAGNOSIS — Z96652 Presence of left artificial knee joint: Secondary | ICD-10-CM | POA: Diagnosis not present

## 2024-05-12 DIAGNOSIS — E039 Hypothyroidism, unspecified: Secondary | ICD-10-CM | POA: Diagnosis not present

## 2024-05-12 DIAGNOSIS — Z85818 Personal history of malignant neoplasm of other sites of lip, oral cavity, and pharynx: Secondary | ICD-10-CM | POA: Diagnosis not present

## 2024-05-12 DIAGNOSIS — M25561 Pain in right knee: Secondary | ICD-10-CM | POA: Diagnosis not present

## 2024-05-12 DIAGNOSIS — M1711 Unilateral primary osteoarthritis, right knee: Secondary | ICD-10-CM | POA: Diagnosis not present

## 2024-05-12 DIAGNOSIS — I10 Essential (primary) hypertension: Secondary | ICD-10-CM | POA: Diagnosis not present

## 2024-05-12 DIAGNOSIS — Z7982 Long term (current) use of aspirin: Secondary | ICD-10-CM | POA: Diagnosis not present

## 2024-05-12 LAB — BASIC METABOLIC PANEL WITH GFR
Anion gap: 11 (ref 5–15)
BUN: 16 mg/dL (ref 8–23)
CO2: 23 mmol/L (ref 22–32)
Calcium: 8.9 mg/dL (ref 8.9–10.3)
Chloride: 96 mmol/L — ABNORMAL LOW (ref 98–111)
Creatinine, Ser: 0.74 mg/dL (ref 0.61–1.24)
GFR, Estimated: >60 mL/min (ref >60)
Glucose, Bld: 131 mg/dL — ABNORMAL HIGH (ref 70–99)
Potassium: 4.3 mmol/L (ref 3.5–5.1)
Sodium: 131 mmol/L — ABNORMAL LOW (ref 135–145)

## 2024-05-12 LAB — CBC
HCT: 33.8 % — ABNORMAL LOW (ref 39.0–52.0)
Hemoglobin: 11.8 g/dL — ABNORMAL LOW (ref 13.0–17.0)
MCH: 32.1 pg (ref 26.0–34.0)
MCHC: 34.9 g/dL (ref 30.0–36.0)
MCV: 91.8 fL (ref 80.0–100.0)
Platelets: 192 K/uL (ref 150–400)
RBC: 3.68 MIL/uL — ABNORMAL LOW (ref 4.22–5.81)
RDW: 14.6 % (ref 11.5–15.5)
WBC: 13.3 K/uL — ABNORMAL HIGH (ref 4.0–10.5)
nRBC: 0 % (ref 0.0–0.2)

## 2024-05-12 MED ORDER — TRAMADOL HCL 50 MG PO TABS
50.0000 mg | ORAL_TABLET | Freq: Four times a day (QID) | ORAL | 0 refills | Status: DC | PRN
  Filled 2024-05-12: qty 40, 5d supply, fill #0

## 2024-05-12 MED ORDER — GABAPENTIN 300 MG PO CAPS
ORAL_CAPSULE | ORAL | 0 refills | Status: DC
  Filled 2024-05-12: qty 84, 42d supply, fill #0

## 2024-05-12 MED ORDER — PROMETHAZINE HCL 25 MG PO TABS
25.0000 mg | ORAL_TABLET | Freq: Four times a day (QID) | ORAL | 0 refills | Status: DC | PRN
  Filled 2024-05-12: qty 30, 8d supply, fill #0

## 2024-05-12 MED ORDER — OXYCODONE HCL 5 MG PO TABS
5.0000 mg | ORAL_TABLET | Freq: Four times a day (QID) | ORAL | 0 refills | Status: DC | PRN
  Filled 2024-05-12: qty 42, 6d supply, fill #0

## 2024-05-12 MED ORDER — METHOCARBAMOL 500 MG PO TABS
500.0000 mg | ORAL_TABLET | Freq: Four times a day (QID) | ORAL | 0 refills | Status: DC | PRN
  Filled 2024-05-12: qty 40, 10d supply, fill #0

## 2024-05-12 MED ORDER — ASPIRIN 81 MG PO CHEW
81.0000 mg | CHEWABLE_TABLET | Freq: Two times a day (BID) | ORAL | 0 refills | Status: AC
  Filled 2024-05-12: qty 42, 21d supply, fill #0

## 2024-05-12 NOTE — Care Management Obs Status (Signed)
 MEDICARE OBSERVATION STATUS NOTIFICATION   Patient Details  Name: QUINTUS PREMO MRN: [id number] Date of Birth: 1956/08/06   Medicare Observation Status Notification Given:  Chaney NORMAN ASPEN, LCSW 05/12/2024, 9:56 AM

## 2024-05-12 NOTE — Plan of Care (Signed)
  Problem: Health Behavior/Discharge Planning: Goal: Ability to manage health-related needs will improve Outcome: Adequate for Discharge   Problem: Clinical Measurements: Goal: Ability to maintain clinical measurements within normal limits will improve Outcome: Adequate for Discharge Goal: Will remain free from infection Outcome: Adequate for Discharge Goal: Diagnostic test results will improve Outcome: Adequate for Discharge Goal: Respiratory complications will improve Outcome: Adequate for Discharge Goal: Cardiovascular complication will be avoided Outcome: Adequate for Discharge   Problem: Activity: Goal: Risk for activity intolerance will decrease Outcome: Adequate for Discharge   Problem: Nutrition: Goal: Adequate nutrition will be maintained Outcome: Adequate for Discharge   Problem: Coping: Goal: Level of anxiety will decrease Outcome: Adequate for Discharge   Problem: Elimination: Goal: Will not experience complications related to bowel motility Outcome: Adequate for Discharge Goal: Will not experience complications related to urinary retention Outcome: Adequate for Discharge   Problem: Pain Managment: Goal: General experience of comfort will improve and/or be controlled Outcome: Adequate for Discharge   Problem: Safety: Goal: Ability to remain free from injury will improve Outcome: Adequate for Discharge   Problem: Skin Integrity: Goal: Risk for impaired skin integrity will decrease Outcome: Adequate for Discharge   Problem: Education: Goal: Knowledge of the prescribed therapeutic regimen will improve Outcome: Adequate for Discharge   Problem: Activity: Goal: Ability to avoid complications of mobility impairment will improve Outcome: Adequate for Discharge Goal: Range of joint motion will improve Outcome: Adequate for Discharge   Problem: Clinical Measurements: Goal: Postoperative complications will be avoided or minimized Outcome: Adequate for  Discharge   Problem: Pain Management: Goal: Pain level will decrease with appropriate interventions Outcome: Adequate for Discharge   Problem: Skin Integrity: Goal: Will show signs of wound healing Outcome: Adequate for Discharge

## 2024-05-12 NOTE — Progress Notes (Signed)
 Physical Therapy Treatment Patient Details Name: Terry Holt MRN: [id number] DOB: 1955-10-24 Today's Date: 05/12/2024   History of Present Illness Pt is 68 yo male s/p R TKA on 05/11/24.  Pt with hx including but not limited to overactive bladder, OA, tongue CA, HTN, HPV, L TKA 2021    PT Comments  Pt is POD # 1 and is progressing well.  Pt with excellent pain control, ROM, quad activation, understanding of safety and HEP.  He ambulated 300' and performed stairs similar to home set up. Pt demonstrates safe gait & transfers in order to return home from PT perspective once discharged by MD.  While in hospital, will continue to benefit from PT for skilled therapy to advance mobility and exercises.       If plan is discharge home, recommend the following: A little help with walking and/or transfers;Assistance with cooking/housework;A little help with bathing/dressing/bathroom;Help with stairs or ramp for entrance   Can travel by private vehicle        Equipment Recommendations  None recommended by PT    Recommendations for Other Services       Precautions / Restrictions Precautions Precautions: Fall;Knee Required Braces or Orthoses: Knee Immobilizer - Right Knee Immobilizer - Right: Discontinue once straight leg raise with < 10 degree lag Restrictions RLE Weight Bearing Per Provider Order: Weight bearing as tolerated     Mobility  Bed Mobility Overal bed mobility: Modified Independent                  Transfers Overall transfer level: Needs assistance Equipment used: Rolling walker (2 wheels) Transfers: Sit to/from Stand Sit to Stand: Supervision           General transfer comment: Performed STS x 3 without difficulty    Ambulation/Gait Ambulation/Gait assistance: Supervision Gait Distance (Feet): 300 Feet Assistive device: Rolling walker (2 wheels) Gait Pattern/deviations: Step-through pattern, Decreased stride length, Decreased weight shift to right        General Gait Details: Slight decrease in weight shift R; tolerated well; good speed for POD1   Stairs Stairs: Yes Stairs assistance: Contact guard assist Stair Management: Step to pattern, Forwards, With cane Number of Stairs: 6 General stair comments: initial cues for cane placement; wife provided cues as needed on last bout of steps   Wheelchair Mobility     Tilt Bed    Modified Rankin (Stroke Patients Only)       Balance Overall balance assessment: Needs assistance Sitting-balance support: No upper extremity supported Sitting balance-Leahy Scale: Good     Standing balance support: No upper extremity supported, Bilateral upper extremity supported Standing balance-Leahy Scale: Fair Standing balance comment: steady with RW ambulating; could static stand without support                            Communication    Cognition Arousal: Alert Behavior During Therapy: WFL for tasks assessed/performed   PT - Cognitive impairments: No apparent impairments                                Cueing    Exercises Total Joint Exercises Ankle Circles/Pumps: AROM, Both, 10 reps, Supine Quad Sets: AROM, Both, 10 reps, Supine Heel Slides: AAROM, Right, 10 reps, Supine Hip ABduction/ADduction: AAROM, Right, 10 reps, Supine Long Arc Quad: AROM, Right, 10 reps, Seated Knee Flexion: AROM, Right, 10 reps, Seated Goniometric ROM:  R knee ~ 5 to 90 degrees    General Comments  Educated on safe ice use, no pivots, car transfers, resting with leg straight, and TED hose during day. Also, encouraged walking every 1-2 hours during day. Educated on HEP with focus on mobility the first weeks. Discussed doing exercises within pain control and if pain increasing could decreased ROM, reps, and stop exercises as needed. Encouraged to perform quad sets and ankle pumps frequently for blood flow and to promote full knee extension.       Pertinent Vitals/Pain Pain  Assessment Pain Assessment: 0-10 Pain Score: 2  Pain Location: R knee Pain Descriptors / Indicators: Discomfort Pain Intervention(s): Limited activity within patient's tolerance, Monitored during session, Premedicated before session, Ice applied    Home Living                          Prior Function            PT Goals (current goals can now be found in the care plan section) Progress towards PT goals: Progressing toward goals    Frequency    7X/week      PT Plan      Co-evaluation              AM-PAC PT 6 Clicks Mobility   Outcome Measure  Help needed turning from your back to your side while in a flat bed without using bedrails?: None Help needed moving from lying on your back to sitting on the side of a flat bed without using bedrails?: None Help needed moving to and from a bed to a chair (including a wheelchair)?: A Little Help needed standing up from a chair using your arms (e.g., wheelchair or bedside chair)?: A Little Help needed to walk in hospital room?: A Little Help needed climbing 3-5 steps with a railing? : A Little 6 Click Score: 20    End of Session Equipment Utilized During Treatment: Gait belt Activity Tolerance: Patient tolerated treatment well Patient left: with call bell/phone within reach;in chair;with family/visitor present Nurse Communication: Mobility status PT Visit Diagnosis: Muscle weakness (generalized) (M62.81);Other abnormalities of gait and mobility (R26.89)     Time: 8990-8965 PT Time Calculation (min) (ACUTE ONLY): 25 min  Charges:    $Gait Training: 8-22 mins $Therapeutic Exercise: 8-22 mins PT General Charges $$ ACUTE PT VISIT: 1 Visit                     Benjiman, PT Acute Rehab [facility] [phone]    Benjiman VEAR [place] 05/12/2024, 11:32 AM

## 2024-05-12 NOTE — Progress Notes (Signed)
 Discharge meds in a secure bag delivered to pt in room by this RN

## 2024-05-12 NOTE — TOC Transition Note (Signed)
 Transition of Care [facility]) - Discharge Note   Patient Details  Name: Terry Holt MRN: [id number] Date of Birth: Oct 13, 1955  Transition of Care [facility]) CM/SW Contact:  NORMAN ASPEN, LCSW Phone Number: 05/12/2024, 10:02 AM   Clinical Narrative:     Met with pt who confirms he has needed DME in the home.  OPPT already arranged with [facility].  No further IP CM needs.  Final next level of care: OP Rehab Barriers to Discharge: No Barriers Identified   Patient Goals and CMS Choice Patient states their goals for this hospitalization and ongoing recovery are:: return home          Discharge Placement                       Discharge Plan and Services Additional resources added to the After Visit Summary for                  DME Arranged: N/A DME Agency: NA                  Social Drivers of Health (SDOH) Interventions SDOH Screenings   Food Insecurity: No Food Insecurity (05/11/2024)  Housing: Low Risk  (05/11/2024)  Transportation Needs: No Transportation Needs (05/11/2024)  Utilities: Not At Risk (05/11/2024)  Alcohol Screen: Low Risk  (08/16/2023)  Depression (PHQ2-9): Low Risk  (07/19/2021)  Financial Resource Strain: Low Risk  (08/16/2023)  Physical Activity: Sufficiently Active (08/16/2023)  Social Connections: Unknown (05/11/2024)  Stress: No Stress Concern Present (08/16/2023)  Tobacco Use: Medium Risk (05/11/2024)     Readmission Risk Interventions     No data to display

## 2024-05-12 NOTE — Plan of Care (Signed)
  Problem: Pain Management: Goal: Pain level will decrease with appropriate interventions Outcome: Progressing   Problem: Activity: Goal: Range of joint motion will improve Outcome: Progressing   Problem: Coping: Goal: Level of anxiety will decrease Outcome: Progressing   Problem: Clinical Measurements: Goal: Ability to maintain clinical measurements within normal limits will improve Outcome: Progressing
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
## 2024-05-18 DIAGNOSIS — Z471 Aftercare following joint replacement surgery: Secondary | ICD-10-CM | POA: Diagnosis not present

## 2024-05-20 DIAGNOSIS — Z471 Aftercare following joint replacement surgery: Secondary | ICD-10-CM | POA: Diagnosis not present
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
## 2024-05-25 DIAGNOSIS — Z471 Aftercare following joint replacement surgery: Secondary | ICD-10-CM | POA: Diagnosis not present

## 2024-05-27 DIAGNOSIS — Z471 Aftercare following joint replacement surgery: Secondary | ICD-10-CM | POA: Diagnosis not present

## 2024-06-01 DIAGNOSIS — Z471 Aftercare following joint replacement surgery: Secondary | ICD-10-CM | POA: Diagnosis not present

## 2024-06-03 DIAGNOSIS — Z471 Aftercare following joint replacement surgery: Secondary | ICD-10-CM | POA: Diagnosis not present

## 2024-06-05 ENCOUNTER — Encounter: Payer: Self-pay | Admitting: Family Medicine

## 2024-06-05 DIAGNOSIS — Z471 Aftercare following joint replacement surgery: Secondary | ICD-10-CM | POA: Diagnosis not present

## 2024-06-05 MED ORDER — CIPROFLOXACIN HCL 500 MG PO TABS: 500.0000 mg | ORAL_TABLET | Freq: Two times a day (BID) | ORAL | 0 refills | Status: DC

## 2024-06-05 NOTE — Telephone Encounter (Signed)
 I sent in a RX for Cipro 

## 2024-06-06 DIAGNOSIS — I10 Essential (primary) hypertension: Secondary | ICD-10-CM | POA: Diagnosis not present

## 2024-06-08 DIAGNOSIS — Z471 Aftercare following joint replacement surgery: Secondary | ICD-10-CM | POA: Diagnosis not present

## 2024-06-08 MED ORDER — DOXYCYCLINE HYCLATE 100 MG PO TABS: 100.0000 mg | ORAL_TABLET | Freq: Two times a day (BID) | ORAL | 0 refills | Status: DC

## 2024-06-08 NOTE — Telephone Encounter (Signed)
 Yes stop the Cipro and start the Doxycycline 

## 2024-06-08 NOTE — Telephone Encounter (Signed)
 OK we'll try Doxycycline  instead

## 2024-06-10 DIAGNOSIS — M9901 Segmental and somatic dysfunction of cervical region: Secondary | ICD-10-CM | POA: Diagnosis not present

## 2024-06-10 DIAGNOSIS — M9903 Segmental and somatic dysfunction of lumbar region: Secondary | ICD-10-CM | POA: Diagnosis not present

## 2024-06-10 DIAGNOSIS — M9902 Segmental and somatic dysfunction of thoracic region: Secondary | ICD-10-CM | POA: Diagnosis not present

## 2024-06-10 DIAGNOSIS — Z471 Aftercare following joint replacement surgery: Secondary | ICD-10-CM | POA: Diagnosis not present

## 2024-06-10 DIAGNOSIS — M9905 Segmental and somatic dysfunction of pelvic region: Secondary | ICD-10-CM | POA: Diagnosis not present

## 2024-06-12 ENCOUNTER — Encounter: Payer: Self-pay | Admitting: Family Medicine

## 2024-06-12 ENCOUNTER — Ambulatory Visit (INDEPENDENT_AMBULATORY_CARE_PROVIDER_SITE_OTHER): Admitting: Family Medicine

## 2024-06-12 VITALS — BP 128/68 | HR 76 | Temp 98.1°F | Wt 212.0 lb

## 2024-06-12 DIAGNOSIS — R102 Pelvic and perineal pain unspecified side: Secondary | ICD-10-CM

## 2024-06-12 DIAGNOSIS — F5232 Male orgasmic disorder: Secondary | ICD-10-CM | POA: Diagnosis not present

## 2024-06-12 NOTE — Progress Notes (Signed)
   Subjective:    Patient ID: Terry Holt, male    DOB: Mar 23, 1956, 68 y.o.   MRN: [id number]  HPI Here for several issues. He had sent us  several messages in the past week about feeling mild aching sensation in the genitals as well as a mild tenderness in the right groin. He though this was due to a prostate infection, so we have supplied him with antibiotics. He took Cipro for 4 days with no relief, so we changed this to Doxycycline . He has been taking this for the past 4 days. The symptoms above have not changed. He denies any urinary urgency or burning. No fever. He also notes that since his right knee replacement surgery on 05-11-24 he has had difficulty achieving an orgasm during sex. He has no trouble with achieving and keeping an erection, but he cannot reach orgasm. He notes that on the day of his surgery an attempt was made to give him spinal anesthesia. This was not successful, so he ended up getting general anesthesia. He also notes that on the day of DC he was sent home on Gabapentin . His UA today is clear.    Review of Systems  Constitutional: Negative.   Respiratory: Negative.    Cardiovascular: Negative.   Gastrointestinal: Negative.   Genitourinary:  Negative for difficulty urinating, dysuria, flank pain, frequency, hematuria, testicular pain and urgency.  Musculoskeletal:  Positive for back pain.       Objective:   Physical Exam Constitutional:      General: He is not in acute distress.    Appearance: Normal appearance.  Cardiovascular:     Rate and Rhythm: Normal rate and regular rhythm.     Pulses: Normal pulses.     Heart sounds: Normal heart sounds.  Pulmonary:     Effort: Pulmonary effort is normal.     Breath sounds: Normal breath sounds.  Genitourinary:    Penis: Normal.      Testes: Normal.     Prostate: Normal.     Rectum: Normal.     Comments: The right epididymis is slightly tender  Musculoskeletal:     Comments: Lower back is not tender. Lumbar  spine and both hips have full ROM  Neurological:     Mental Status: He is alert.           Assessment & Plan:  He does not have an infection, so we will stop the Doxycycline . His anorgasmia is likely a side effect of the Gabapentin , so we will stop this as well. The pelvic discomfort is likely the result of recent intercourse without orgasms. All these issues should resolve over the next week or so as the Gabapentin  gets out of his system. He will refrain from sex for the next 10 days. Recheck as needed.  Garnette Olmsted, MD

## 2024-06-16 DIAGNOSIS — Z471 Aftercare following joint replacement surgery: Secondary | ICD-10-CM | POA: Diagnosis not present

## 2024-06-16 DIAGNOSIS — Z5189 Encounter for other specified aftercare: Secondary | ICD-10-CM | POA: Diagnosis not present

## 2024-06-18 DIAGNOSIS — Z471 Aftercare following joint replacement surgery: Secondary | ICD-10-CM | POA: Diagnosis not present

## 2024-06-19 DIAGNOSIS — L308 Other specified dermatitis: Secondary | ICD-10-CM | POA: Diagnosis not present

## 2024-06-22 ENCOUNTER — Encounter: Payer: Self-pay | Admitting: Family Medicine

## 2024-06-22 DIAGNOSIS — Z471 Aftercare following joint replacement surgery: Secondary | ICD-10-CM | POA: Diagnosis not present

## 2024-06-24 NOTE — Telephone Encounter (Signed)
Sounds good.  Follow up as needed

## 2024-06-29 DIAGNOSIS — Z471 Aftercare following joint replacement surgery: Secondary | ICD-10-CM | POA: Diagnosis not present

## 2024-07-01 ENCOUNTER — Encounter: Payer: Self-pay | Admitting: Family Medicine

## 2024-07-01 ENCOUNTER — Ambulatory Visit (INDEPENDENT_AMBULATORY_CARE_PROVIDER_SITE_OTHER): Admitting: Family Medicine

## 2024-07-01 ENCOUNTER — Ambulatory Visit: Payer: Self-pay | Admitting: Family Medicine

## 2024-07-01 VITALS — BP 128/76 | HR 82 | Temp 98.1°F | Wt 208.0 lb

## 2024-07-01 DIAGNOSIS — Z471 Aftercare following joint replacement surgery: Secondary | ICD-10-CM | POA: Diagnosis not present

## 2024-07-01 DIAGNOSIS — N4281 Prostatodynia syndrome: Secondary | ICD-10-CM

## 2024-07-01 LAB — PSA: PSA: 1.3 ng/mL (ref 0.10–4.00)

## 2024-07-01 NOTE — Progress Notes (Signed)
 v  Subjective:    Patient ID: Terry Holt, male    DOB: [birth year], 68 y.o.   MRN: [id number]  HPI Here for continued discomfort in the perineal area. This started 2 months ago after he had his knee surgery. He has sen use several times for this. His UA was clear. He has taken some Cipro and then some Doxycycline  to cover possible prostatitis, but these do not help. He is urinating freely. He saw us  a few weeks ago complaining of anorgasmia. We advised him to stop taking Gabapentin , and this fixed the problem.    Review of Systems  Constitutional: Negative.   Respiratory: Negative.    Cardiovascular: Negative.   Gastrointestinal: Negative.   Genitourinary:  Negative for difficulty urinating, dysuria, hematuria, testicular pain and urgency.       Objective:   Physical Exam Constitutional:      Appearance: Normal appearance.  Cardiovascular:     Rate and Rhythm: Normal rate and regular rhythm.     Pulses: Normal pulses.     Heart sounds: Normal heart sounds.  Pulmonary:     Effort: Pulmonary effort is normal.     Breath sounds: Normal breath sounds.  Neurological:     Mental Status: He is alert.           Assessment & Plan:  Perineal pain. We will refer him to Urology to evaluate this further. Check a PSA today.  Garnette Olmsted, MD

## 2024-07-03 DIAGNOSIS — Z471 Aftercare following joint replacement surgery: Secondary | ICD-10-CM | POA: Diagnosis not present

## 2024-07-07 DIAGNOSIS — Z471 Aftercare following joint replacement surgery: Secondary | ICD-10-CM | POA: Diagnosis not present

## 2024-07-07 DIAGNOSIS — I10 Essential (primary) hypertension: Secondary | ICD-10-CM | POA: Diagnosis not present

## 2024-07-09 ENCOUNTER — Other Ambulatory Visit: Payer: Self-pay | Admitting: Family Medicine

## 2024-07-10 DIAGNOSIS — Z471 Aftercare following joint replacement surgery: Secondary | ICD-10-CM | POA: Diagnosis not present

## 2024-07-13 ENCOUNTER — Encounter: Payer: Self-pay | Admitting: Family Medicine

## 2024-07-14 ENCOUNTER — Ambulatory Visit: Admitting: Primary Care

## 2024-07-14 ENCOUNTER — Encounter: Payer: Self-pay | Admitting: Primary Care

## 2024-07-14 VITALS — BP 136/62 | HR 90 | Temp 97.7°F | Ht 73.0 in | Wt 208.8 lb

## 2024-07-14 DIAGNOSIS — G4733 Obstructive sleep apnea (adult) (pediatric): Secondary | ICD-10-CM

## 2024-07-14 DIAGNOSIS — Z87891 Personal history of nicotine dependence: Secondary | ICD-10-CM | POA: Diagnosis not present

## 2024-07-14 NOTE — Patient Instructions (Signed)
  VISIT SUMMARY: You came in today for a follow-up visit regarding your obstructive sleep apnea. We discussed your current treatment with CPAP therapy and reviewed your progress since starting the therapy.  YOUR PLAN: -OBSTRUCTIVE SLEEP APNEA: Obstructive sleep apnea is a condition where your airway becomes blocked during sleep, causing breathing interruptions. Your sleep study showed moderate sleep apnea, and you have been using a CPAP machine to help keep your airway open. Your current AHI with CPAP is 4.5, which means your apnea is well controlled. You reported improved energy levels and reduced daytime napping. We recommend continuing your CPAP therapy with the current settings. To improve comfort and reduce air leaks, try using CPAP pillows designed for side sleepers. If you are not using water  in the humidifier, turn off the humidification to prevent condensation and potential damage to the machine. We will schedule a follow-up in six months to renew your CPAP supplies and assess your compliance.  INSTRUCTIONS: We will schedule a follow-up appointment in six months to renew your CPAP supplies and assess your compliance with the therapy.                      Contains text generated by [employer].                                 Contains text generated by [employer].
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
## 2024-07-20 DIAGNOSIS — G4733 Obstructive sleep apnea (adult) (pediatric): Secondary | ICD-10-CM | POA: Insufficient documentation

## 2024-07-20 DIAGNOSIS — Z471 Aftercare following joint replacement surgery: Secondary | ICD-10-CM | POA: Diagnosis not present

## 2024-07-21 DIAGNOSIS — K080 Exfoliation of teeth due to systemic causes: Secondary | ICD-10-CM | POA: Diagnosis not present

## 2024-07-23 ENCOUNTER — Encounter: Payer: Self-pay | Admitting: Family Medicine

## 2024-07-23 ENCOUNTER — Ambulatory Visit: Admitting: Family Medicine

## 2024-07-23 VITALS — BP 110/70 | HR 98 | Temp 99.3°F | Wt 203.0 lb

## 2024-07-23 DIAGNOSIS — N411 Chronic prostatitis: Secondary | ICD-10-CM | POA: Diagnosis not present

## 2024-07-23 LAB — URINALYSIS, ROUTINE W REFLEX MICROSCOPIC
Bilirubin Urine: NEGATIVE
Hgb urine dipstick: NEGATIVE
Leukocytes,Ua: NEGATIVE
Nitrite: NEGATIVE
Specific Gravity, Urine: 1.01 (ref 1.000–1.030)
Total Protein, Urine: 30 — AB
Urine Glucose: NEGATIVE
Urobilinogen, UA: 1 (ref 0.0–1.0)
pH: 7 (ref 5.0–8.0)

## 2024-07-23 LAB — CBC WITH DIFFERENTIAL/PLATELET
Basophils Absolute: 0.1 K/uL (ref 0.0–0.1)
Basophils Relative: 0.4 % (ref 0.0–3.0)
Eosinophils Absolute: 0 K/uL (ref 0.0–0.7)
Eosinophils Relative: 0.1 % (ref 0.0–5.0)
HCT: 39.6 % (ref 39.0–52.0)
Hemoglobin: 13.9 g/dL (ref 13.0–17.0)
Lymphocytes Relative: 5.4 % — ABNORMAL LOW (ref 12.0–46.0)
Lymphs Abs: 0.9 K/uL (ref 0.7–4.0)
MCHC: 35.1 g/dL (ref 30.0–36.0)
MCV: 93.1 fl (ref 78.0–100.0)
Monocytes Absolute: 2 K/uL — ABNORMAL HIGH (ref 0.1–1.0)
Monocytes Relative: 12.1 % — ABNORMAL HIGH (ref 3.0–12.0)
Neutro Abs: 13.7 K/uL — ABNORMAL HIGH (ref 1.4–7.7)
Neutrophils Relative %: 82 % — ABNORMAL HIGH (ref 43.0–77.0)
Platelets: 248 K/uL (ref 150.0–400.0)
RBC: 4.25 Mil/uL (ref 4.22–5.81)
RDW: 12.4 % (ref 11.5–15.5)
WBC: 16.7 K/uL — ABNORMAL HIGH (ref 4.0–10.5)

## 2024-07-23 LAB — BASIC METABOLIC PANEL WITH GFR
BUN: 13 mg/dL (ref 6–23)
CO2: 30 meq/L (ref 19–32)
Calcium: 9.9 mg/dL (ref 8.4–10.5)
Chloride: 89 meq/L — ABNORMAL LOW (ref 96–112)
Creatinine, Ser: 0.73 mg/dL (ref 0.40–1.50)
GFR: 94.45 mL/min (ref >60.00)
Glucose, Bld: 122 mg/dL — ABNORMAL HIGH (ref 70–99)
Potassium: 3.8 meq/L (ref 3.5–5.1)
Sodium: 129 meq/L — ABNORMAL LOW (ref 135–145)

## 2024-07-23 MED ORDER — CEFTRIAXONE SODIUM 1 G IJ SOLR
1.0000 g | Freq: Once | INTRAMUSCULAR | Status: AC
  Administered 2024-07-23: 1 g via INTRAMUSCULAR

## 2024-07-23 MED ORDER — ONDANSETRON HCL 8 MG PO TABS: 8.0000 mg | ORAL_TABLET | Freq: Four times a day (QID) | ORAL | 0 refills | Status: DC | PRN

## 2024-07-23 MED ORDER — DOXYCYCLINE HYCLATE 100 MG PO TABS: 100.0000 mg | ORAL_TABLET | Freq: Two times a day (BID) | ORAL | 0 refills | Status: DC

## 2024-07-23 NOTE — Progress Notes (Signed)
   Subjective:    Patient ID: Terry Holt, male    DOB: Jun 24, 1956, 68 y.o.   MRN: [id number]  HPI Here to follow up on a probable prostate infection. We have seen him several times for this. For the past 10 weeks he has not felt well. He feels tired and his appetite is poor. He has had low grade fevers off and on. He took a course of Cipro  with no improvement. He then started on Doxycycline , but it sounds like he only took this for about a week. We referred him to [facility], but they cannot see him until 08-18-24. He has increased pain in the perineal area and in the testicles.    Review of Systems  Constitutional:  Positive for fever.  Respiratory: Negative.    Cardiovascular: Negative.   Gastrointestinal: Negative.   Genitourinary:  Positive for testicular pain. Negative for difficulty urinating, dysuria, flank pain, frequency, hematuria, scrotal swelling and urgency.       Objective:   Physical Exam Constitutional:      Appearance: Normal appearance.  Cardiovascular:     Rate and Rhythm: Normal rate and regular rhythm.     Pulses: Normal pulses.     Heart sounds: Normal heart sounds.  Pulmonary:     Effort: Pulmonary effort is normal.     Breath sounds: Normal breath sounds.  Neurological:     Mental Status: He is alert.           Assessment & Plan:  Chronic prostatitis. He is given a shot of Rocephin, and we will start him on 30 days of Doxycycline . He will drink lots of fluids. We will get labs including CBC, BMET, UA, and urine culture. We will also do an urgent referral to [facility] to see if they can see him sooner.  Garnette Olmsted, MD

## 2024-07-23 NOTE — Addendum Note (Signed)
 Addended by: LADONNA INOCENTE SAILOR on: 07/23/2024 11:44 AM   Modules accepted: Orders

## 2024-07-24 LAB — URINE CULTURE
MICRO NUMBER:: 17313228
Result:: NO GROWTH
SPECIMEN QUALITY:: ADEQUATE

## 2024-07-27 ENCOUNTER — Ambulatory Visit: Payer: Self-pay | Admitting: Family Medicine

## 2024-07-29 DIAGNOSIS — Z471 Aftercare following joint replacement surgery: Secondary | ICD-10-CM | POA: Diagnosis not present

## 2024-07-31 ENCOUNTER — Other Ambulatory Visit: Payer: Self-pay | Admitting: Family Medicine

## 2024-08-26 ENCOUNTER — Other Ambulatory Visit: Payer: Self-pay

## 2024-08-26 DIAGNOSIS — I6529 Occlusion and stenosis of unspecified carotid artery: Secondary | ICD-10-CM
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
## 2024-09-18 ENCOUNTER — Ambulatory Visit: Admitting: Vascular Surgery

## 2024-09-18 ENCOUNTER — Encounter: Payer: Self-pay | Admitting: Vascular Surgery

## 2024-09-18 ENCOUNTER — Ambulatory Visit (HOSPITAL_COMMUNITY)
Admission: RE | Admit: 2024-09-18 | Discharge: 2024-09-18 | Disposition: A | Source: Ambulatory Visit | Attending: Surgery | Admitting: Surgery

## 2024-09-18 VITALS — BP 170/87 | HR 73 | Temp 98.0°F | Resp 18 | Ht 73.0 in | Wt 208.0 lb

## 2024-09-18 DIAGNOSIS — I6521 Occlusion and stenosis of right carotid artery: Secondary | ICD-10-CM

## 2024-09-18 DIAGNOSIS — I6529 Occlusion and stenosis of unspecified carotid artery: Secondary | ICD-10-CM | POA: Insufficient documentation

## 2024-09-18 NOTE — Progress Notes (Deleted)
Office Note     CC:  follow up Requesting Provider:  Dettinger, Joshua A, MD  HPI: Terry Dallas Fallin Jr. is a 69 y.o. (12/19/1940) male who presents for evaluation of asymptomatic carotid artery stenosis.  Since last office visit in February 2023, he has not been diagnosed with CVA or TIA.  He also has not had any strokelike symptoms including slurring speech, changes in vision, or one-sided weakness.  He is on a daily statin.  He remains fairly active either using the elliptical machine or golfing on a daily basis.  He takes an aspirin every other day.  He is a former smoker.   Past Medical History:  Diagnosis Date   Bilateral carotid artery stenosis without cerebral infarction    Asymptomatic---bilateral ICA <40%  and mild stenosis bilateral ECA   BPH (benign prostatic hypertrophy) with urinary obstruction    Cancer (HCC) 2004   bladder   Diverticulosis of colon    Glaucoma    Guillain Barr syndrome (HCC) 2001   Heart murmur March /  April 2016   History of bladder cancer    first dx 2004--  tx with chemo bladder instillation   History of colon polyps    History of pleural effusion    2001   History of vertebral fracture    2001--   T12   Hyperlipidemia    Hypothyroidism    Pneumonia 2001   Varicose veins     Past Surgical History:  Procedure Laterality Date   CYSTO/  BILATERAL RETROGRADE PYLOGRAM/  RESECTION BLADDER TUMOR  04-10-2011   CYSTOSCOPY WITH BIOPSY N/A 09/23/2014   Procedure: CYSTOSCOPY WITH BLADDER  BIOPSY, FULGERATION;  Surgeon: John J Wrenn, MD;  Location: [facility];  Service: Urology;  Laterality: N/A;   NEGATIVE SLEEP STUDY  YRS AGO per pt   ORIF RIGHT DISTAL RADIUS FX  09-11-2007   TONSILLECTOMY  as child   TRANSURETHRAL RESECTION OF BLADDER TUMOR WITH MITOMYCIN-C  02-16-2003   VIDEO ASSISTED THORACOSCOPY (VATS)/EMPYEMA Right 01-11-2000   and Drainage of effusion    Social History   Socioeconomic History   Marital status: Married     Spouse name: Not on file   Number of children: 2   Years of education: Not on file   Highest education level: Not on file  Occupational History   Occupation: retired    Comment: self employed-printing  Tobacco Use   Smoking status: Former    Packs/day: 1.00    Years: 20.00    Total pack years: 20.00    Types: Cigarettes    Quit date: 08/20/1981    Years since quitting: 41.2    Passive exposure: Never   Smokeless tobacco: Never  Vaping Use   Vaping Use: Never used  Substance and Sexual Activity   Alcohol use: Yes    Alcohol/week: 2.0 standard drinks of alcohol    Types: 1 Shots of liquor, 1 Standard drinks or equivalent per week   Drug use: No   Sexual activity: Not on file  Other Topics Concern   Not on file  Social History Narrative   Lives at home with wife and daughter.      Social Determinants of Health   Financial Resource Strain: Low Risk  (01/12/2020)   Overall Financial Resource Strain (CARDIA)    Difficulty of Paying Living Expenses: Not hard at all  Food Insecurity: No Food Insecurity (01/12/2020)   Hunger Vital Sign    Worried About Running Out   of Food in the Last Year: Never true    Ran Out of Food in the Last Year: Never true  Transportation Needs: No Transportation Needs (01/12/2020)   PRAPARE - Transportation    Lack of Transportation (Medical): No    Lack of Transportation (Non-Medical): No  Physical Activity: Sufficiently Active (01/12/2020)   Exercise Vital Sign    Days of Exercise per Week: 5 days    Minutes of Exercise per Session: 60 min  Stress: No Stress Concern Present (01/12/2020)   [employer] - Occupational Stress Questionnaire    Feeling of Stress : Not at all  Social Connections: Socially Integrated (01/12/2020)   Social Connection and Isolation Panel [NHANES]    Frequency of Communication with Friends and Family: More than three times a week    Frequency of Social Gatherings with Friends and Family: More than  three times a week    Attends Religious Services: More than 4 times per year    Active Member of Clubs or Organizations: Yes    Attends Club or Organization Meetings: More than 4 times per year    Marital Status: Married  Intimate Partner Violence: Not At Risk (01/12/2020)   Humiliation, Afraid, Rape, and Kick questionnaire    Fear of Current or Ex-Partner: No    Emotionally Abused: No    Physically Abused: No    Sexually Abused: No    Family History  Problem Relation Age of Onset   Other Mother        varicose veins   Cancer Mother        Ovarian   Varicose Veins Mother    Nephrolithiasis Mother    COPD Father    Hyperlipidemia Father    Cancer Maternal Grandmother    Bipolar disorder Daughter    Healthy Daughter    Hypertension Sister    Healthy Daughter    Colon cancer Neg Hx     Current Outpatient Medications  Medication Sig Dispense Refill   atorvastatin (LIPITOR) 20 MG tablet Take 1 tablet (20 mg total) by mouth daily. 90 tablet 2   BIOTIN 5000 PO Take 5,000 mg by mouth daily.     brimonidine (ALPHAGAN) 0.2 % ophthalmic solution Place into the right eye 2 (two) times daily.     cholecalciferol (VITAMIN D) 1000 units tablet Take 5,000 Units by mouth daily.      Coenzyme Q10 (COQ10) 200 MG CAPS Take 1 capsule by mouth daily.     CRANBERRY PO Take 4,200 mg by mouth daily.     diphenhydrAMINE HCl (BENADRYL ALLERGY PO) Take by mouth as needed.     dorzolamide-timolol (COSOPT) 22.3-6.8 MG/ML ophthalmic solution Place 1 drop into both eyes 2 (two) times daily.     ferrous gluconate (FERGON) 240 (27 FE) MG tablet Take 1 tablet (240 mg total) by mouth daily. 90 tablet 3   fexofenadine (ALLEGRA) 180 MG tablet Take 180 mg by mouth daily.     folic acid (V-R FOLIC ACID) 400 MCG tablet Take 1 tablet (400 mcg total) by mouth daily. 90 tablet 3   hyoscyamine (LEVSIN SL) 0.125 MG SL tablet DISSOLVE 1 TABLET IN MOUTH EVERY 8 HOURS AS NEEDED 120 tablet 2   KRILL OIL PO Take 1 Capful  by mouth daily.     latanoprost (XALATAN) 0.005 % ophthalmic solution Place 1 drop into both eyes at bedtime.     levothyroxine (SYNTHROID) 88 MCG tablet Take 1 tablet (88 mcg total) by   mouth daily before breakfast. 90 tablet 2   Magnesium 500 MG CAPS Take 1 capsule by mouth daily.     Multiple Vitamin (MULTIVITAMIN WITH MINERALS) TABS tablet Take 1 tablet by mouth daily.     Multiple Vitamins-Minerals (HAIR SKIN NAILS PO) Take 1 tablet by mouth daily.     Omega-3 Fatty Acids (FISH OIL) 1200 MG CAPS Take by mouth.     No current facility-administered medications for this visit.    Allergies  Allergen Reactions   Bcg Live Other (See Comments)    Treatment pt had- shakes   Septra [Bactrim] Other (See Comments)    Muscle weakness, numbness   Sulfa Antibiotics Other (See Comments)    Severe weakness   Sulfamethoxazole-Trimethoprim Other (See Comments)    Muscle weakness, numbness Muscle weakness, numbness   Pertussis Vaccines     History of Gillian barrie      REVIEW OF SYSTEMS:   [X] denotes positive finding, [ ] denotes negative finding Cardiac  Comments:  Chest pain or chest pressure:    Shortness of breath upon exertion:    Short of breath when lying flat:    Irregular heart rhythm:        Vascular    Pain in calf, thigh, or hip brought on by ambulation:    Pain in feet at night that wakes you up from your sleep:     Blood clot in your veins:    Leg swelling:         Pulmonary    Oxygen at home:    Productive cough:     Wheezing:         Neurologic    Sudden weakness in arms or legs:     Sudden numbness in arms or legs:     Sudden onset of difficulty speaking or slurred speech:    Temporary loss of vision in one eye:     Problems with dizziness:         Gastrointestinal    Blood in stool:     Vomited blood:         Genitourinary    Burning when urinating:     Blood in urine:        Psychiatric    Major depression:         Hematologic    Bleeding  problems:    Problems with blood clotting too easily:        Skin    Rashes or ulcers:        Constitutional    Fever or chills:      PHYSICAL EXAMINATION:  Vitals:   10/23/22 1501 10/23/22 1503  BP: 138/65 128/71  Pulse: 61   Resp: 20   Temp: 98.3 F (36.8 C)   TempSrc: Temporal   SpO2: 98%   Weight: 182 lb 12.8 oz (82.9 kg)   Height: 5' 9" (1.753 m)     General:  WDWN in NAD; vital signs documented above Gait: Not observed HENT: WNL, normocephalic Pulmonary: normal non-labored breathing , without Rales, rhonchi,  wheezing Cardiac: regular HR Abdomen: soft, NT, no masses Skin: without rashes Vascular Exam/Pulses:  Right Left  Radial 2+ (normal) 2+ (normal)   Extremities: without ischemic changes, without Gangrene , without cellulitis; without open wounds;  Musculoskeletal: no muscle wasting or atrophy  Neurologic: A&O X 3;  CN grossly intact Psychiatric:  The pt has Normal affect.   Non-Invasive Vascular Imaging:   Right ICA 40 to 59% Left   ICA 1 to 39%    ASSESSMENT/PLAN:: 69 y.o. male here for follow up for surveillance of carotid artery stenosis  -Subjectively has not had any neurological events since last office visit -Carotid duplex demonstrates right ICA stenosis of 40 to 59% which is up from last office visit of 1 to 39%; left ICA stenosis is stable at 1 to 39% -Given the increase in stenosis of the right ICA, we will recheck carotid duplex in 6 months.  If stable at that time we can return to annual follow-up.  He will continue his aspirin and statin.  He knows to call/return office sooner with any questions or concerns.   Matthew Eveland, PA-C [facility] [phone]  Clinic MD:   [place]  

## 2024-09-23 ENCOUNTER — Other Ambulatory Visit: Payer: Self-pay | Admitting: Family Medicine

## 2024-09-28 ENCOUNTER — Ambulatory Visit: Admitting: Family Medicine
